# Patient Record
Sex: Male | Born: 1985 | Race: White | Hispanic: No | Marital: Single | Smoking: Former smoker
Health system: Southern US, Community
[De-identification: ages and names within clinical notes are randomized; demographics above are authoritative.]

## PROBLEM LIST (undated history)

## (undated) ENCOUNTER — Emergency Department (HOSPITAL_BASED_OUTPATIENT_CLINIC_OR_DEPARTMENT_OTHER): Admission: EM

## (undated) DIAGNOSIS — I10 Essential (primary) hypertension: Secondary | ICD-10-CM

## (undated) DIAGNOSIS — F1120 Opioid dependence, uncomplicated: Secondary | ICD-10-CM

## (undated) DIAGNOSIS — K859 Acute pancreatitis without necrosis or infection, unspecified: Secondary | ICD-10-CM

## (undated) DIAGNOSIS — F1020 Alcohol dependence, uncomplicated: Secondary | ICD-10-CM

## (undated) DIAGNOSIS — R7303 Prediabetes: Secondary | ICD-10-CM

## (undated) DIAGNOSIS — F418 Other specified anxiety disorders: Secondary | ICD-10-CM

## (undated) HISTORY — DX: Opioid dependence, uncomplicated: F11.20

## (undated) HISTORY — DX: Prediabetes: R73.03

## (undated) HISTORY — DX: Other specified anxiety disorders: F41.8

## (undated) HISTORY — DX: Alcohol dependence, uncomplicated: F10.20

## (undated) HISTORY — PX: OTHER SURGICAL HISTORY: SHX169

---

## 2005-09-27 ENCOUNTER — Emergency Department: Payer: Self-pay | Admitting: Emergency Medicine

## 2006-10-06 ENCOUNTER — Emergency Department: Payer: Self-pay | Admitting: Emergency Medicine

## 2008-09-27 ENCOUNTER — Emergency Department: Payer: Self-pay | Admitting: Unknown Physician Specialty

## 2009-07-09 IMAGING — CT CT HEAD WITHOUT CONTRAST
3 of 4 series · 17 of 30 positions shown, 20 images · non-contrast
Comparison: none

REASON FOR EXAM: head injury
COMMENTS:

[Series 2: without · axial · non-contrast · 0.44mm/px · z∈[+6,+141]mm · 9 of 35 slices shown, 12 images (1 of 2)]
[im 4/35  brain]
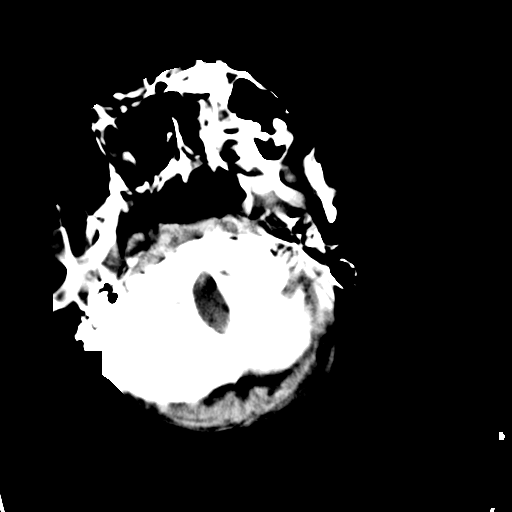
[im 4/35  bone]
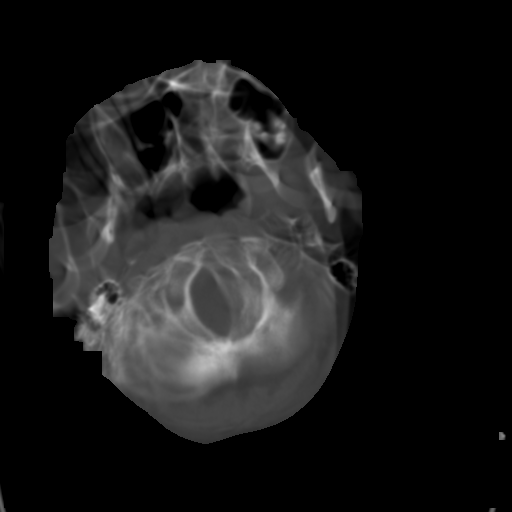
[im 7/35  brain]
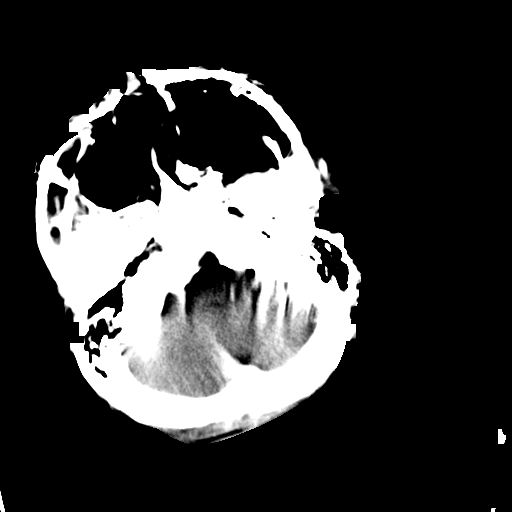
[im 11/35  brain]
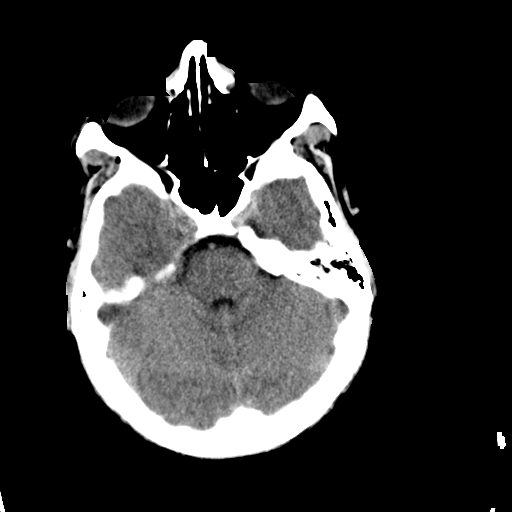
[im 14/35  brain]
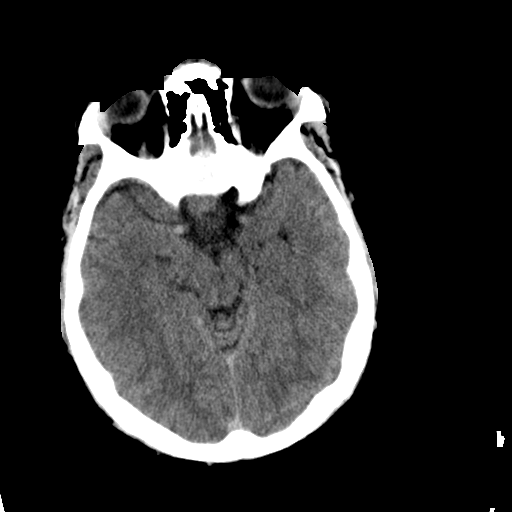
[im 18/35  brain]
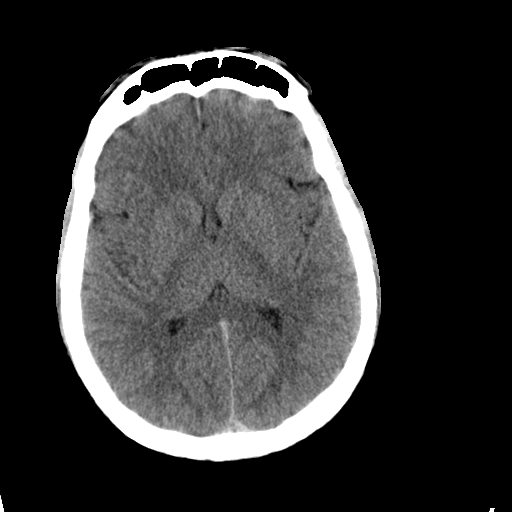
[im 18/35  bone]
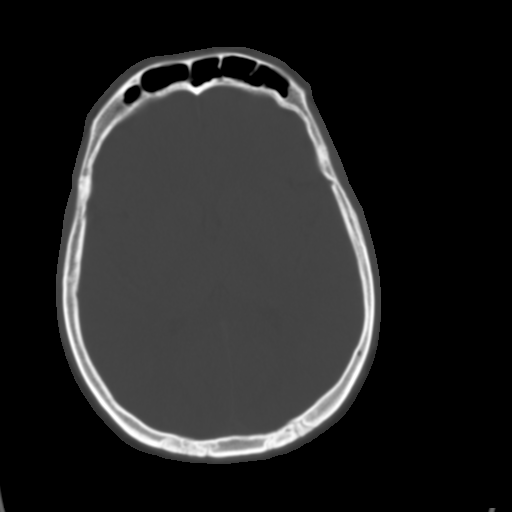
[im 21/35  brain]
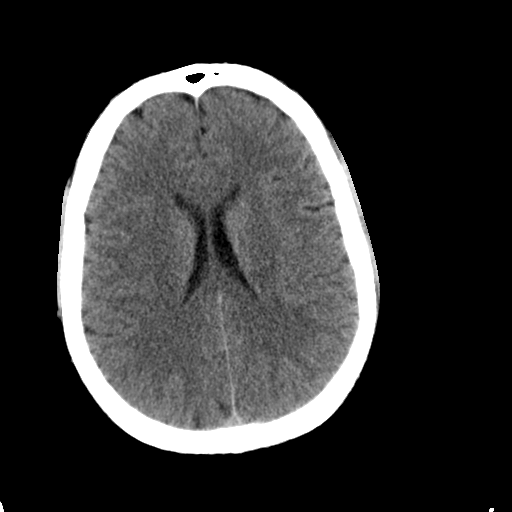
[im 24/35  brain]
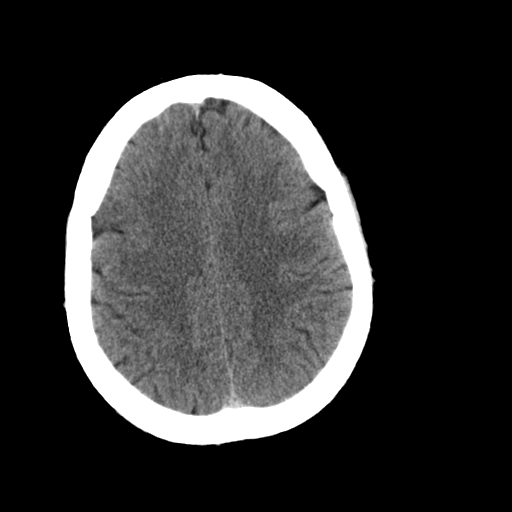
[im 28/35  brain]
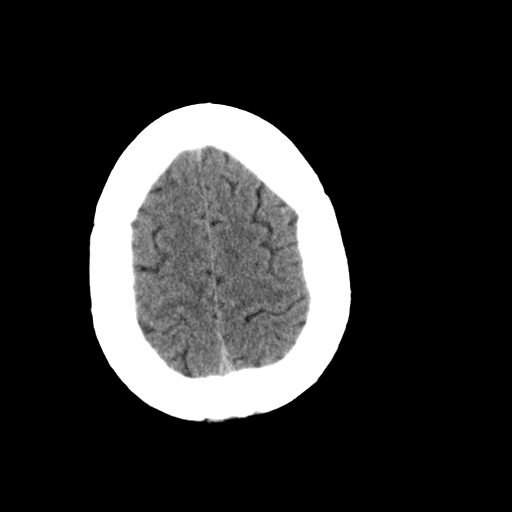
[im 31/35  brain]
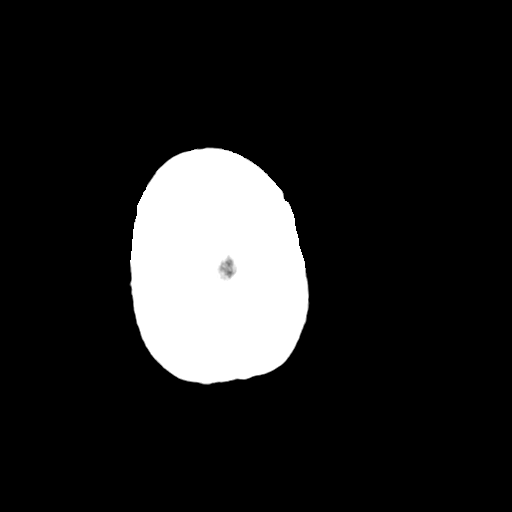
[im 31/35  bone]
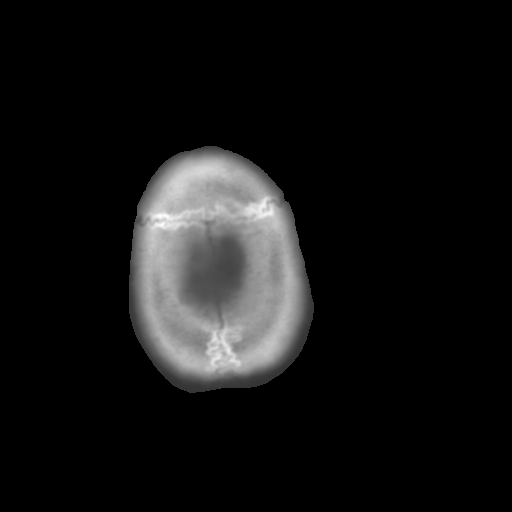

[Series 3: bone · axial · 0.44mm/px · z∈[+6,+106]mm · 6 of 35 slices shown]
[im 4/35  bone]
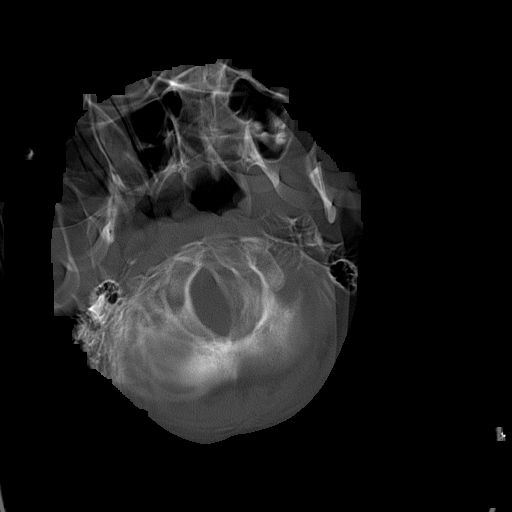
[im 7/35  bone]
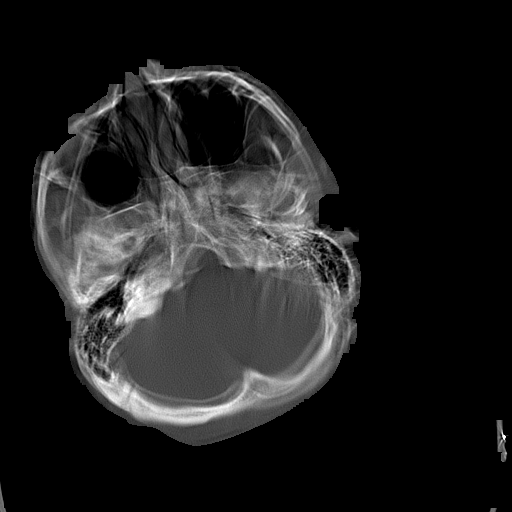
[im 11/35  bone]
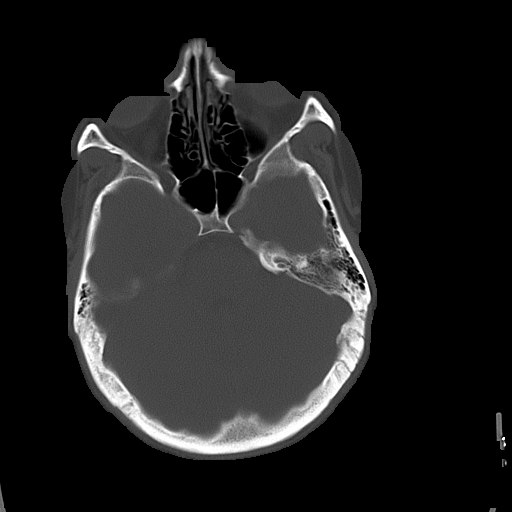
[im 14/35  bone]
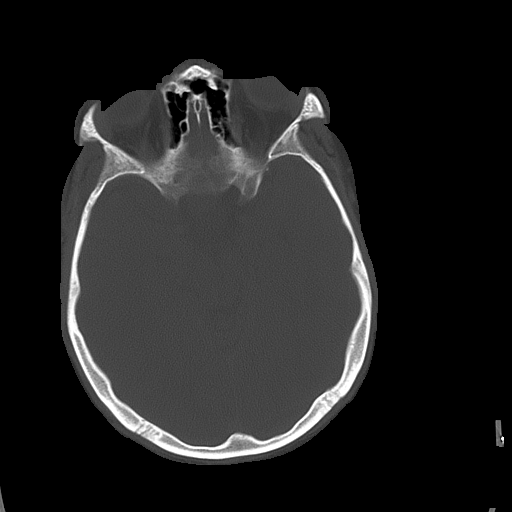
[im 21/35  bone]
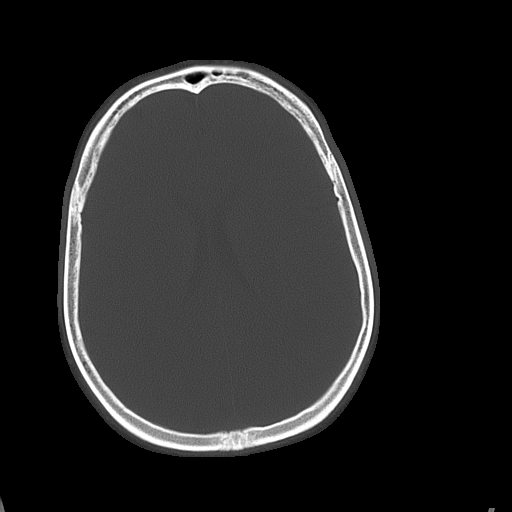
[im 24/35  bone]
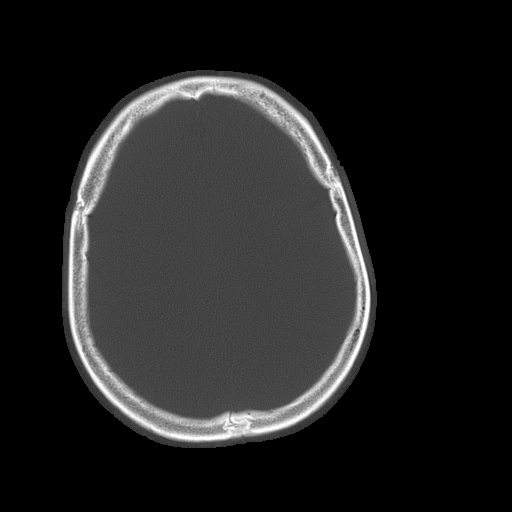

[Series 4: without · axial · non-contrast · 0.44mm/px · z∈[+11,+36]mm · 2 of 15 slices shown (2 of 2)]
[im 5/15  brain]
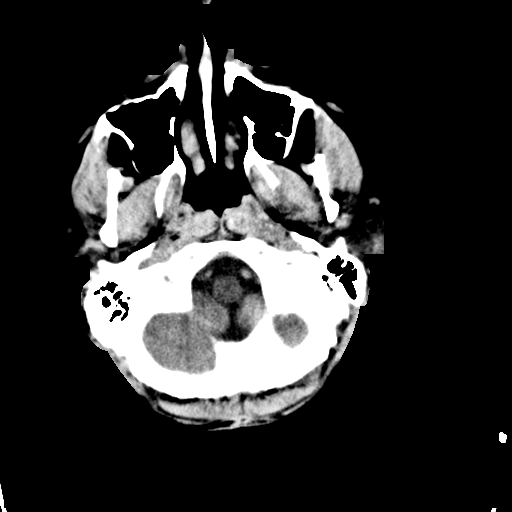
[im 10/15  brain]
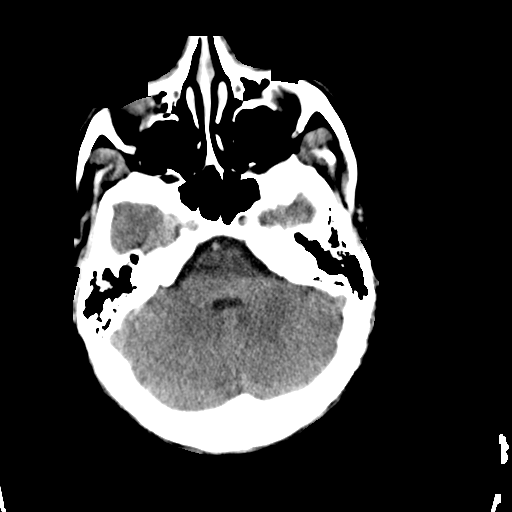

[17 of 30 positions shown; findings below may reference images not displayed]

PROCEDURE:     CT  - CT HEAD WITHOUT CONTRAST  - September 27, 2008  [DATE]

RESULT:     Noncontrast emergent CT of the brain is performed. There is no
previous exam for comparison.

The ventricles and sulci are normal. There is no hemorrhage. There is no
focal mass, mass-effect or midline shift. There is no evidence of edema or
territorial infarct. The bone windows demonstrate normal aeration of the
paranasal sinuses and mastoid air cells. There is no skull fracture
demonstrated.
IMPRESSION: 1. No acute intracranial abnormality.

## 2016-03-18 ENCOUNTER — Encounter: Payer: Self-pay | Admitting: Emergency Medicine

## 2016-03-18 ENCOUNTER — Telehealth: Payer: Self-pay | Admitting: General Practice

## 2016-03-18 ENCOUNTER — Ambulatory Visit
Admission: EM | Admit: 2016-03-18 | Discharge: 2016-03-18 | Disposition: A | Discharge status: Home / Self Care | Payer: BLUE CROSS/BLUE SHIELD

## 2016-03-18 DIAGNOSIS — M7711 Lateral epicondylitis, right elbow: Secondary | ICD-10-CM | POA: Diagnosis not present

## 2016-03-18 DIAGNOSIS — J0101 Acute recurrent maxillary sinusitis: Secondary | ICD-10-CM | POA: Diagnosis not present

## 2016-03-18 DIAGNOSIS — M7712 Lateral epicondylitis, left elbow: Secondary | ICD-10-CM

## 2016-03-18 DIAGNOSIS — H6593 Unspecified nonsuppurative otitis media, bilateral: Secondary | ICD-10-CM | POA: Diagnosis not present

## 2016-03-18 MED ORDER — DICLOFENAC 18 MG PO CAPS: 18.0000 mg | ORAL_CAPSULE | Freq: Three times a day (TID) | ORAL | 0 refills | Status: DC

## 2016-03-18 MED ORDER — FLUTICASONE PROPIONATE 50 MCG/ACT NA SUSP: 1.0000 | Freq: Two times a day (BID) | NASAL | 0 refills | Status: AC

## 2016-03-18 MED ORDER — DICLOFENAC SODIUM 75 MG PO TBEC: 75.0000 mg | DELAYED_RELEASE_TABLET | Freq: Two times a day (BID) | ORAL | 0 refills | Status: AC

## 2016-03-18 MED ORDER — SALINE NASAL SPRAY 0.65 % NA SOLN: 2.0000 | NASAL | 12 refills | Status: AC

## 2016-03-18 MED ORDER — AMOXICILLIN-POT CLAVULANATE 875-125 MG PO TABS: 1.0000 | ORAL_TABLET | Freq: Two times a day (BID) | ORAL | 0 refills | Status: AC

## 2016-03-18 MED ORDER — NAPROXEN 500 MG PO TABS: 500.0000 mg | ORAL_TABLET | Freq: Two times a day (BID) | ORAL | 0 refills | Status: DC

## 2016-03-18 NOTE — ED Provider Notes (Signed)
CSN: [id number]     Arrival date & time 03/18/16  1449 History   None    Chief Complaint  Patient presents with  . Cough  . Headache  . Elbow Pain   (Consider location/radiation/quality/duration/timing/severity/associated sxs/prior Treatment) Single caucasian male here for evaluation sinus pressure, ear pressure x 2 days, nasal congestion didn't go to work today as felt bad.  Also having bilateral elbow pain left x 4 months and right x 3 months has tried elbow straps and advil/aleve/motrin OTC po without any relief would like stronger medication/steroids.  Has not been icing.  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  PMHX ADHD, alcohol abuse, alcohol gastritis, arm fracture  PSHx denied  FHx denied cancer      History reviewed. No pertinent past medical history. History reviewed. No pertinent surgical history. History reviewed. No pertinent family history. Social History  Substance Use Topics  . Smoking status: Current Every Day Smoker    Packs/day: 1.00    Types: Cigarettes  . Smokeless tobacco: Never Used  . Alcohol use Yes    Review of Systems  Constitutional: Positive for chills and fatigue. Negative for activity change, appetite change, diaphoresis, fever and unexpected weight change.  HENT: Positive for congestion, ear pain, postnasal drip, rhinorrhea and sinus pressure. Negative for dental problem, drooling, ear discharge, facial swelling, hearing loss, mouth sores, nosebleeds, sneezing, sore throat, tinnitus, trouble swallowing and voice change.   Eyes: Negative for photophobia, pain, discharge, redness, itching and visual disturbance.  Respiratory: Positive for cough. Negative for choking, chest tightness, shortness of breath, wheezing and stridor.   Cardiovascular: Negative for chest pain, palpitations and leg swelling.  Gastrointestinal: Negative for abdominal distention, abdominal pain, blood in stool, constipation, diarrhea, nausea and vomiting.  Endocrine:  Negative for cold intolerance and heat intolerance.  Genitourinary: Negative for dysuria.  Musculoskeletal: Positive for arthralgias and myalgias. Negative for back pain, gait problem, joint swelling, neck pain and neck stiffness.  Skin: Negative for color change, pallor, rash and wound.  Allergic/Immunologic: Positive for environmental allergies. Negative for food allergies and immunocompromised state.  Neurological: Negative for dizziness, tremors, seizures, syncope, facial asymmetry, speech difficulty, weakness, light-headedness, numbness and headaches.  Hematological: Negative for adenopathy. Does not bruise/bleed easily.  Psychiatric/Behavioral: Negative for agitation, behavioral problems, confusion and sleep disturbance.    Allergies  Review of patient's allergies indicates no known allergies.  Home Medications   Prior to Admission medications   Medication Sig Start Date End Date Taking? Authorizing Provider  amphetamine-dextroamphetamine (ADDERALL XR) 20 MG 24 hr capsule Take 20 mg by mouth daily.   Yes Historical Provider, MD  amoxicillin-clavulanate (AUGMENTIN) 875-125 MG tablet Take 1 tablet by mouth every 12 (twelve) hours. 03/18/16 03/28/16  Barbaraann Barthel, NP  Diclofenac 18 MG CAPS Take 18 mg by mouth 3 (three) times daily. 03/18/16 04/17/16  Barbaraann Barthel, NP  fluticasone (FLONASE) 50 MCG/ACT nasal spray Place 1 spray into both nostrils 2 (two) times daily. 03/18/16   Barbaraann Barthel, NP  sodium chloride (RA SALINE NASAL SPRAY) 0.65 % nasal spray Place 2 sprays into the nose every 2 (two) hours while awake. 03/18/16 03/28/16  Barbaraann Barthel, NP   Meds Ordered and Administered this Visit  Medications - No data to display  BP 133/81 (BP Location: Left Arm)   Pulse 100   Temp 99.3 F (37.4 C) (Tympanic)   Resp 16   SpO2 99%  No data found.   Physical Exam  Constitutional:  He is oriented to person, place, and time. Vital signs are normal. He appears well-developed  and well-nourished. He is active and cooperative.  Non-toxic appearance. He does not have a sickly appearance. He appears ill. No distress.  HENT:  Head: Normocephalic and atraumatic.  Right Ear: Hearing, external ear and ear canal normal. A middle ear effusion is present.  Left Ear: Hearing, external ear and ear canal normal. A middle ear effusion is present.  Nose: Mucosal edema and rhinorrhea present. No nose lacerations, sinus tenderness, nasal deformity, septal deviation or nasal septal hematoma. No epistaxis.  No foreign bodies. Right sinus exhibits maxillary sinus tenderness. Right sinus exhibits no frontal sinus tenderness. Left sinus exhibits maxillary sinus tenderness. Left sinus exhibits no frontal sinus tenderness.  Mouth/Throat: Uvula is midline and mucous membranes are normal. Mucous membranes are not pale, not dry and not cyanotic. He does not have dentures. No oral lesions. No trismus in the jaw. Normal dentition. No dental abscesses, uvula swelling, lacerations or dental caries. Posterior oropharyngeal edema and posterior oropharyngeal erythema present. No oropharyngeal exudate or tonsillar abscesses.  Cobblestoning posterior pharynx; bilateral nasal turbinates edema/erythema yellow discharge, bilateral TMs with air fluid level slight opacity; bilateral allergic shiners  Eyes: Conjunctivae, EOM and lids are normal. Pupils are equal, round, and reactive to light. Right eye exhibits no chemosis, no discharge, no exudate and no hordeolum. No foreign body present in the right eye. Left eye exhibits no chemosis, no discharge, no exudate and no hordeolum. No foreign body present in the left eye. Right conjunctiva is not injected. Right conjunctiva has no hemorrhage. Left conjunctiva is not injected. Left conjunctiva has no hemorrhage. No scleral icterus. Right eye exhibits normal extraocular motion and no nystagmus. Left eye exhibits normal extraocular motion and no nystagmus. Right pupil is round  and reactive. Left pupil is round and reactive. Pupils are equal.  Neck: Trachea normal and normal range of motion. Neck supple. No tracheal tenderness, no spinous process tenderness and no muscular tenderness present. No neck rigidity. No tracheal deviation, no edema, no erythema and normal range of motion present. No thyroid mass and no thyromegaly present.  Cardiovascular: Normal rate, regular rhythm, S1 normal, S2 normal, normal heart sounds and intact distal pulses.  PMI is not displaced.  Exam reveals no gallop and no friction rub.   No murmur heard. Pulses:      Radial pulses are 2+ on the right side, and 2+ on the left side.  Pulmonary/Chest: Effort normal and breath sounds normal. No stridor. No respiratory distress. He has no decreased breath sounds. He has no wheezes. He has no rhonchi. He has no rales.  Abdominal: Soft. Normal appearance. He exhibits no distension.  Musculoskeletal: Normal range of motion. He exhibits no edema.       Right shoulder: Normal.       Left shoulder: Normal.       Right elbow: He exhibits normal range of motion, no swelling, no effusion, no deformity and no laceration. Tenderness found. Lateral epicondyle tenderness noted. No radial head, no medial epicondyle and no olecranon process tenderness noted.       Left elbow: He exhibits normal range of motion, no swelling, no effusion, no deformity and no laceration. Tenderness found. Lateral epicondyle tenderness noted. No radial head, no medial epicondyle and no olecranon process tenderness noted.       Right wrist: Normal.       Left wrist: Normal.       Right hip: Normal.  Left hip: Normal.       Right knee: Normal.       Left knee: Normal.       Right ankle: Normal.       Left ankle: Normal.       Cervical back: Normal.       Thoracic back: Normal.       Lumbar back: Normal.       Right upper arm: Normal.       Left upper arm: Normal.       Right forearm: He exhibits tenderness. He exhibits no  bony tenderness, no swelling, no edema, no deformity and no laceration.       Left forearm: He exhibits tenderness. He exhibits no bony tenderness, no swelling, no edema, no deformity and no laceration.       Right hand: Normal.       Left hand: Normal.  Lateral epicondyle soft tissue TTP bilaterally right greater than left; mild medial TTP bilaterally; pain with rotation forearms anterior to posterior (medially) bilaterally no crepitus no noticeable edema  Lymphadenopathy:       Head (right side): No submental, no submandibular, no tonsillar, no preauricular, no posterior auricular and no occipital adenopathy present.       Head (left side): No submental, no submandibular, no tonsillar, no preauricular, no posterior auricular and no occipital adenopathy present.    He has no cervical adenopathy.       Right cervical: No superficial cervical, no deep cervical and no posterior cervical adenopathy present.      Left cervical: No superficial cervical, no deep cervical and no posterior cervical adenopathy present.  Neurological: He is alert and oriented to person, place, and time. He is not disoriented. He displays no atrophy and no tremor. No cranial nerve deficit or sensory deficit. He exhibits normal muscle tone. He displays no seizure activity. Coordination and gait normal. GCS eye subscore is 4. GCS verbal subscore is 5. GCS motor subscore is 6.  Bilateral hand grasp equal 5/5 arom x 4 extremities strength equal gait sure and steady in hallway  Skin: Skin is warm, dry and intact. No abrasion, no bruising, no burn, no ecchymosis, no laceration, no lesion, no petechiae and no rash noted. He is not diaphoretic. No cyanosis or erythema. No pallor. Nails show no clubbing.  Psychiatric: He has a normal mood and affect. His speech is normal and behavior is normal. Judgment and thought content normal. Cognition and memory are normal.  Nursing note and vitals reviewed.   Urgent Care Course   Clinical  Course    Procedures (including critical care time)  Labs Review Labs Reviewed - No data to display  Imaging Review No results found.   Reviewed [place] Controlled Substances Registry for previous year of Rx history see below copied:  Last Name:  [facility]:  First Name:  Fouad  Zip Code:  Date of Birth:  02-25-1986  Dispensed Start Date:  03/09/2015 Dispensed End Date:  03/18/2016  Recipients:      Dispensed From 03/09/2015 to 03/18/2016 1 out of 1 Recipient(s) Selected Omar, Melanie - DOB: October 01, 1985 - [address] " Date Dispensed/ Date Prescribed Drug Name/ NDC Qty. Dispensed/ Days Supply Refill #/ Authorized Refills Gaffer Recipient *Pmt. Method **MED Daily  03/14/2016 03/14/2016 DEXTROAMP- AMPHETAMIN 20 MG TAB [id number] 84 28 0 0 [id number] [facility] [place], [place] [id number] [facility] [place], [place] Folse, Shreyan [birth year] [address]  [place], [place] [address] 04 0  02/15/2016 02/15/2016 BUPRENORPHIN- NALOXON 8- 2 MG SL [id number] 70 28 0 0 [id number] [place] [facility] [facility], [place] [id number] [facility] CENTRAL OUTPATIENT [address] [place] NAIF, ALABI 04-29-1986 [address] [place], [place] [address] 04 600  02/15/2016 02/15/2016 DEXTROAMP- AMPHETAMIN 20 MG TAB [id number] 84 28 0 0 [id number] [place], [place] [id number] [place] [facility] [place], [place], Kaymon 26-Feb-1986 [address] [place], [place] [address] 04 0  01/25/2016 01/25/2016 BUPRENORPHIN- NALOXON 8- 2 MG SL [id number] 42 21 0 0 [id number] [place], [place] [id number] [facility]  CENTRAL OUTPATIENT [address] [place] ANITA, MCADORY Mar 18, 1986 [address] [place], [place] [address] 04 480  01/25/2016 01/25/2016 DEXTROAMP- AMPHETAMIN 20 MG TAB [id number] 63 21 0 0 [id number] [place], [place] [id number] [place] [facility] [place], [place],  Zidane 08-02-1986 [address] [place], [place] [address] 04 0  01/11/2016 01/11/2016 BUPRENORPHIN- NALOXON 8- 2 MG SL [id number] 28 14 0 0 [id number] [place], [place] [id number] [facility] CENTRAL OUTPATIENT [address] [place] QUINTAVIS, BRANDS 27-Jul-1986 [address] [place], [place] [address] 04 480  01/11/2016 01/11/2016 DEXTROAMP- AMPHETAMIN 20 MG TAB [id number] 42 14 0 0 [id number] [facility] [place], [place] [id number] [place] [facility] [place], [place], Keniel [birth year] [address] [place], [place] [address] 04 0  12/30/2015 12/30/2015 BUPRENORPHIN- NALOXON 8- 2 MG SL [id number] 28 14 0 0 [id number] [place] ROB[place], [place] [id number] [facility] CENTRAL OUTPATIENT [address], [place] YURI, FANA 1985-12-21 [address] [place], [place] [address] 04 480  12/30/2015 12/30/2015 DEXTROAMP- AMPHETAMIN 20 MG TAB [id number] 42 14 0 0 [id number] [facility] [place], [place] [id number] [facility] [place], [place] Costales, Becket 09/15/1985 [address] [place], [place] [address] 04 0  11/30/2015 11/30/2015 BUPRENORPHIN- NALOXON 8- 2 MG SL [id number] 56 28 0 0 [id number] [place] ROB[place], [place] [id number] [facility] CENTRAL OUTPATIENT [address], [place] DEWAINE, MOROCHO Dec 23, 1985 [address] [place], [place] [address] 04 480  11/30/2015 11/30/2015 DEXTROAMP- AMPHETAMIN 20 MG TAB [id number] 84 28 0 0 [id number] [facility] [address] [place], [place] [id number] [facility] [place], [place] Prospero, Romulo [birth year] [address] [place], [place] [address] 04 0  11/16/2015 11/16/2015 BUPRENORPHIN- NALOXON 8- 2 MG SL [id number] 28 14 0 0 [id number] [place] ROB[place], [place] [id number] [facility] CENTRAL OUTPATIENT [address] [place] LUISDAVID, HAMBLIN [birth year] [address] [place], [place] [address] 04 480  11/16/2015 11/16/2015 DEXTROAMP- AMPHETAMIN 20 MG TAB [id number] 42 14 0 0  [id number] [facility] [address] [place], [place] [id number] ECKERD [place], [place] Summer, Raequon 03/12/1986 [address] [place], [place] [address] 04 0  11/02/2015 11/02/2015 BUPRENORPHIN- NALOXON 8- 2 MG SL [id number] 28 14 0 0 [id number] [place] ROB[place], [place] [id number] [facility] CENTRAL [address], [place] YOREL, REDDER 02-Aug-1986 [address] [place], [place] [address] 04 480  11/02/2015 11/02/2015 DEXTROAMP- AMPHETAMIN 20 MG TAB [id number] 42 14 0 0 [id number] [place], [place] [id number] [place] [facility] [place], [place], Gerado 19-Jun-1986 [address] [place], [place] [address] 04 0  09/29/2015 08/31/2015 BUPRENORPHIN- NALOXON 8- 2 MG SL [id number] 56 28 1 1  [id number] [place] ROB[place], [place] [id number] [facility] CENTRAL OUTPATIENT [address] [place] KASHAUN, BEBO Mar 29, 1986 [address] [place], [place] [address] 04 480  09/28/2015 08/31/2015 DEXTROAMP- AMPHETAMIN 20  MG TAB [id number] 90 30 0 0 [id number] [facility] [place] [id number] [place] [facility] [place], [place]uv[place], [place] [birth year] [address] [place], [place] [id number] 04 0  08/31/2015 08/31/2015 DEXTROAMP- AMPHETAMIN 20 MG TAB [id number] 90 30 0 0 [id number] [facility] [place], [place] [id number] [facility] [place], Ignacio [place], [place] [birth year] [address] [place], [place] [id number] 04 0  08/31/2015 08/31/2015 BUPRENORPHIN- NALOXON 8- 2 MG SL [id number] 56 28 0 1 [id number] [place] ROB[place], [place] [id number] [facility] CENTRAL OUTPATIENT [address]PHARMACY [place], [place] EzelMOORE, Demonie G [birth year] [address] [place], [place] [id number] 04 480  08/21/2015 08/17/2015 BUPRENORPHIN- NALOXON 8- 2 MG SL [id number] 28 14 0 0 [id number] [place] ROB[place], [place] [id number] [facility] CEN[facility]AL [facility]PATIENT [address]PHARMACY [place], [place] LatheMOORE, Ismeal G [birth year] [address] [place], [place] [id number] 04 480    08/18/2015 08/18/2015 DEXTROAMP- AMPHETAMIN 20 MG TAB [id number] 42 14 0 0 [id number] [address] [facility] [place], [place] [id number] [facility] ChoughWALGRE[facility] [place], [place], Kroy [birth year] [address] [place], [place] [id number] 04 0  07/29/2015 06/29/2015 BUPRENORPHIN- NALOXON 8- 2 MG SL [id number] 56 28 1 1  [id number] [place] ROB[place], [place] [id number] [facility] CEN[facility]AL OUTPATIENT [address]PHARMACY [place], [place] LatheMOORE, Maxmilian G [birth year] [address] [place], [place] [id number] 04 480  06/29/2015 06/29/2015 DEXTROAMP- AMPHETAMIN 20 MG TAB [id number] 168 56 0 0 [id number] [facility] [place], [place] [id number] [facility] ChoughWALGRE[facility] [place], [place] Anzaldo, Dragan [birth year] [address] [place], [place] [id number] 04 0  06/29/2015 06/29/2015 BUPRENORPHIN- NALOXON 8- 2 MG SL [id number] 56 28 0 1 [id number] [place] ROB[place], [place] [id number] [facility] CEN[facility]AL OUTPATIENT [address] [place], [place] LatheMOORE, Montel G [birth year] [address] [place], [place] [id number] 04 480  06/11/2015 06/01/2015 DEXTROAMP- AMPHETAMIN 20 MG TAB [id number] 42 14 0 0 [id number] [facility] [place] [id number] Ber[facility] ChoughWALGRE[facility] [place], [place], [place] [birth year] [address] [place], [place] [id number] 04 0  06/01/2015 06/01/2015 BUPRENORPHIN- NALOXON 8- 2 MG SL [id number] 60 30 0 0 [id number] [place], [place] [id number] [facility] CENTRAL OUTPATIENT [address]PHARMACY [place], [place] LatheMOORE, Anh G [birth year] [address] [place], [place] [id number] 04 480  05/18/2015 05/18/2015 DEXTROAMP- AMPHETAMIN 20 MG TAB [id number] 84 28 0 0 [id number] [facility] [place] [id number] [facility] ChoughW[facility] [address] [place], [place] Curless, Murl [birth year] [address] [place], [place] [id number] 04 0  05/18/2015 05/18/2015 BUPRENORPHIN- NALOXON 8- 2 MG SL [id number] 30 15 0 0 [id number] [address] StreetGARBUT[address] JAMES CAMERON MD [place], [place] [id number] [facility] CENTRAL OUTPATIENT [address]PHARMACY [place], [place] LatheMOORE, Quincey G [birth year] [address] [place], [place] [id number] 04 480  05/04/2015 05/04/2015 DEXTROAMP- AMPHETAMIN 20 MG TAB [id number] 42 14 0 0 [id number] [address]WE[facility] MEM[facility] [place] [id number] Ber[facility] ChoughWALGREEN [facility] [place], [place] Krone, Bienvenido [birth year] [address] [place], [place] [id number] 04 0  05/04/2015 05/04/2015 BUPRENORPHIN- NALOXON 8- 2 MG SL [id number] 28 14 0 5 [id number] [address] StreetGARBUTT JAMES CAMERON MD [place], [place] [id number] [facility] CENTRAL OUTPATIENT [address] [place],  [place] LatheMOORE, Anjelo G [birth year] [address] [place], [place] [id number] 04 480  04/27/2015 04/24/2015 BUPRENORPHIN- NALOXON 8- 2 MG SL [id number] 14 7 0 1 [id number] [address]GARBUTT JAMES CAMERON MD [place], [place] [id number] [facility] CENTRAL OUTPATIENT [address]PHARMACY [place],  [place] LatheMOORE, Yuepheng G [birth year] [address] [place], [place] [id number] 04 480  04/21/2015 04/20/2015 BUPRENORPHIN- NALOXON 8- 2 MG SL [id number] 14 7 0 0 [id number] [address]GARBUTT JAMES CAMERON MD [place], [place] [id number] [facility]ENTRAL  OUTPATIENT [address] [place], [place] Nack, Manoah G Jul 03, 1986 [address] [place], [place] [address] 04 480  04/20/2015 04/20/2015 DEXTROAMP- AMPHETAMIN 20 MG TAB [id number] 42 14 0 0 [id number] [facility] [place], [place] [id number] Berline Chough, [place] Dickison, Devonta [birth year] [address] [place], [place] [address] 04 0  04/09/2015 04/09/2015 DEXTROAMP- AMPHETAMIN 20 MG TAB [id number] 33 11 0 0 [id number] [facility] [place], [place] [id number] Berline Chough, [place] Marcella, Jaecion 05-05-1986 [address] [place], [place] [address] 04 0  03/26/2015 02/23/2015 DEXTROAMP- AMPHETAMIN 20 MG TAB [id number] 84 28 0 0 [id number] [facility] [place], [place] [id number] Berline Chough, [place] Yeh, Ladainian [birth year] [address] [place], [place] [address] 04 0  03/26/2015 02/23/2015 BUPRENORPHIN- NALOXON 8- 2 MG SL [id number] 56 28 0 0 [id number] [address] MD [place], [place] [id number] [facility] CENTRAL  OUTPATIENT PHARMACY [place], [place], Nissim G [birth year] [address] [place], [place] [address] 04 [address]    *Pmt. Method:01=Private Pay; 02=Medicaid; 03=Medicare; 04=Commercial Insurance; Psychologist, educational and VA; 06=Worker's Compensation; 07=[place] Nations; 99=Other  **Per CDC guidance, the conversion factors and associated daily morphine milligram equivalents for drugs prescribed as part of medication-assisted treatment for opioid use disorder should not be used to benchmark against dosage thresholds meant for opioids prescribed for pain. MED Summary This section displays cumulative MED values by unique recipient. The "MED Max" value is the maximum occurrence of cumulative MED sustained for any 3 consecutive days. This value is calculated based on prescriptions dispensed during the date range requested.  MED Max Recipient  [address] [place] [birth year] [address] [place], [place] [address]      MDM   1. Acute recurrent maxillary sinusitis   2. Lateral epicondylitis (tennis elbow), left   3. Lateral epicondylitis (tennis elbow), right   4. Otitis media with effusion, bilateral    Patient given [facility] handout on elbow tendonitis lateral with rehab exercises. Patient has been using elbow strap unilaterally discussed wear bilaterally.  Discussed up to date treatment guidelines recommended imaging if pain longer than 6 weeks and not responsive to OTC nsaids.  Patient has been self treating at home with strap and OTC nsaids for months and symptoms worsening.  Patient refused imaging today.  Discussed next step would be formal PT/Orthopedics consults with possible injections by orthopedics.  Discussed  Repetitive motion injury due to work duties. Discussed rest, nsaid x 2 weeks, stretching, lateral epicondylitis strap purchase OTC none available in clinic and icing TID.  Patient will schedule follow up with [facility] for Physical Therapy referral if no relief with plan of care.  Discussed  with patient takes longer to resolve when ongoing for months consider orthopedics and steroid injection if no relief with NSAID, rest, ice, strap.  Do not take any motrin/advil/aleve/ibuprofen/naproxen while taking diclofenac 75mg  po BID.  Hydrate, hydrate, hydrate, avoid dehydration when taking NSAID. Work excuse given for today. Follow up with PCM if no improvement in symptoms with plan of care.  Patient verbalized understanding of instructions, agreed with plan of care and had no further questions at this time.    Patient may use normal saline nasal spray as needed.  Consider antihistamine OTC claritin/zyrtec OTC 10mg  po daily or nasal steroid use flonase 1 spray each nostril BID.  Avoid triggers if possible.  Shower prior to bedtime if exposed to triggers.  If allergic dust/dust mites recommend mattress/pillow covers/encasements; washing linens, vacuuming, sweeping, dusting weekly.  Call or return to clinic as needed  if these symptoms worsen or fail to improve as anticipated.   Exitcare handout on allergic rhinitis given to patient.  Patient verbalized understanding of instructions, agreed with plan of care and had no further questions at this time.  P2:  Avoidance and hand washing.  Supportive treatment.   No evidence of invasive bacterial infection, non toxic and well hydrated.  This is most likely self limiting viral infection.  I do not see where any further testing or imaging is necessary at this time.   I will suggest supportive care, rest, good hygiene and encourage the patient to take adequate fluids.  The patient is to return to clinic or EMERGENCY ROOM if symptoms worsen or change significantly e.g. ear pain, fever, purulent discharge from ears or bleeding.  Exitcare handout on otitis media with effusion given to patient.  Patient verbalized agreement and understanding of treatment plan.    No evidence of systemic bacterial infection, non toxic and well hydrated.  I do not see where any further  testing or imaging is necessary at this time.   I will suggest supportive care, rest, good hygiene and encourage the patient to take adequate fluids.  The patient is to return to clinic or EMERGENCY ROOM if symptoms worsen or change significantly.  Exitcare handout on sinusitis given to patient.  Patient verbalized agreement and understanding of treatment plan and had no further questions at this time.   P2:  Hand washing and cover cough  start flonase 1 spray each nostril BID, saline 2 sprays each nostril q2h prn congestion.  If no improvement with 48 hours of saline and flonase use start augmentin 875mg  po BID x 10 days.  Rx given.  No evidence of systemic bacterial infection, non toxic and well hydrated.  I do not see where any further testing or imaging is necessary at this time.   I will suggest supportive care, rest, good hygiene and encourage the patient to take adequate fluids.  The patient is to return to clinic or EMERGENCY ROOM if symptoms worsen or change significantly.  Exitcare handout on sinusitis given to patient.  Patient verbalized agreement and understanding of treatment plan and had no further questions at this time.   P2:  Hand washing and cover cough      Barbaraann Barthel, NP 03/18/16 1921

## 2016-03-18 NOTE — ED Triage Notes (Signed)
Patient c/o sinus pressure, HAs, and cough for the past few days.  Patient denies fevers.  Patient also c/o pain in his left elbow for the past 5 months.

## 2016-03-18 NOTE — Telephone Encounter (Signed)
Spoke with pharmacist that naproxen was cancelled and diclofenac ordered per patient preference.  Do not dispense naproxen/cancelled in Epic.  Diclofenac generic or brand name whichever is covered by patient's insurance.  Pharmacist verbalized understanding (he) and had no further questions at this time.

## 2022-11-20 DIAGNOSIS — M5442 Lumbago with sciatica, left side: Secondary | ICD-10-CM | POA: Diagnosis not present

## 2022-11-20 DIAGNOSIS — L732 Hidradenitis suppurativa: Secondary | ICD-10-CM | POA: Diagnosis not present

## 2022-11-27 DIAGNOSIS — R0789 Other chest pain: Secondary | ICD-10-CM | POA: Diagnosis not present

## 2022-11-27 DIAGNOSIS — R519 Headache, unspecified: Secondary | ICD-10-CM | POA: Diagnosis not present

## 2022-11-27 DIAGNOSIS — Z20822 Contact with and (suspected) exposure to covid-19: Secondary | ICD-10-CM | POA: Diagnosis not present

## 2022-11-27 DIAGNOSIS — R112 Nausea with vomiting, unspecified: Secondary | ICD-10-CM | POA: Diagnosis not present

## 2022-11-27 DIAGNOSIS — R1013 Epigastric pain: Secondary | ICD-10-CM | POA: Diagnosis not present

## 2022-11-27 DIAGNOSIS — R079 Chest pain, unspecified: Secondary | ICD-10-CM | POA: Diagnosis not present

## 2022-12-08 DIAGNOSIS — E119 Type 2 diabetes mellitus without complications: Secondary | ICD-10-CM | POA: Diagnosis not present

## 2022-12-08 DIAGNOSIS — L509 Urticaria, unspecified: Secondary | ICD-10-CM | POA: Diagnosis not present

## 2022-12-08 DIAGNOSIS — I10 Essential (primary) hypertension: Secondary | ICD-10-CM | POA: Diagnosis not present

## 2022-12-08 DIAGNOSIS — E871 Hypo-osmolality and hyponatremia: Secondary | ICD-10-CM | POA: Diagnosis not present

## 2022-12-08 DIAGNOSIS — L508 Other urticaria: Secondary | ICD-10-CM | POA: Diagnosis not present

## 2022-12-08 DIAGNOSIS — F411 Generalized anxiety disorder: Secondary | ICD-10-CM | POA: Diagnosis not present

## 2022-12-22 DIAGNOSIS — T7840XA Allergy, unspecified, initial encounter: Secondary | ICD-10-CM | POA: Diagnosis not present

## 2023-03-23 DIAGNOSIS — F411 Generalized anxiety disorder: Secondary | ICD-10-CM | POA: Diagnosis not present

## 2023-03-23 DIAGNOSIS — R1033 Periumbilical pain: Secondary | ICD-10-CM | POA: Diagnosis not present

## 2023-03-23 DIAGNOSIS — I10 Essential (primary) hypertension: Secondary | ICD-10-CM | POA: Diagnosis not present

## 2023-04-01 ENCOUNTER — Encounter: Payer: Self-pay | Admitting: Emergency Medicine

## 2023-04-01 ENCOUNTER — Ambulatory Visit
Admission: EM | Admit: 2023-04-01 | Discharge: 2023-04-01 | Disposition: A | Discharge status: Home / Self Care | Payer: Medicaid Other | Attending: Emergency Medicine | Admitting: Emergency Medicine

## 2023-04-01 DIAGNOSIS — K047 Periapical abscess without sinus: Secondary | ICD-10-CM | POA: Insufficient documentation

## 2023-04-01 DIAGNOSIS — Z1152 Encounter for screening for COVID-19: Secondary | ICD-10-CM | POA: Insufficient documentation

## 2023-04-01 HISTORY — DX: Acute pancreatitis without necrosis or infection, unspecified: K85.90

## 2023-04-01 HISTORY — DX: Essential (primary) hypertension: I10

## 2023-04-01 LAB — SARS CORONAVIRUS 2 BY RT PCR: SARS Coronavirus 2 by RT PCR: NEGATIVE

## 2023-04-01 MED ORDER — ONDANSETRON 8 MG PO TBDP: 8.0000 mg | ORAL_TABLET | Freq: Three times a day (TID) | ORAL | 0 refills | Status: AC | PRN

## 2023-04-01 MED ORDER — CLINDAMYCIN HCL 300 MG PO CAPS: 300.0000 mg | ORAL_CAPSULE | Freq: Three times a day (TID) | ORAL | 0 refills | Status: AC

## 2023-04-01 NOTE — ED Provider Notes (Signed)
[facility]    CSN: [id number] Arrival date & time: 04/01/23  1528      History   Chief Complaint Chief Complaint  Patient presents with   Generalized Body Aches   Fatigue    HPI GILLIAN HALPERT is a 37 y.o. male.   HPI  37 year old male with past medical history significant for pancreatitis and hypertension presents for evaluation of 3 days of feeling poorly along with dental pain and dental drainage.  He denies any fever, runny nose, nasal congestion.  He does have a dentist and he will be contacting them on Monday.  Past Medical History:  Diagnosis Date   Hypertension    Pancreatitis     There are no problems to display for this patient.   History reviewed. No pertinent surgical history.     Home Medications    Prior to Admission medications   Medication Sig Start Date End Date Taking? Authorizing Provider  clindamycin (CLEOCIN) 300 MG capsule Take 1 capsule (300 mg total) by mouth 3 (three) times daily. 04/01/23  Yes Becky Augusta, NP  diazepam (VALIUM) 2 MG tablet Take 2 mg by mouth 2 (two) times daily. 02/24/23  Yes [provider]  diazepam (VALIUM) 5 MG tablet Take 5 mg by mouth 2 (two) times daily. 02/24/23  Yes [provider]  lisinopril (ZESTRIL) 10 MG tablet Take 10 mg by mouth daily.   Yes [provider]  ondansetron (ZOFRAN-ODT) 8 MG disintegrating tablet Take 1 tablet (8 mg total) by mouth every 8 (eight) hours as needed for nausea or vomiting. 04/01/23  Yes Becky Augusta, NP  amphetamine-dextroamphetamine (ADDERALL XR) 20 MG 24 hr capsule Take 20 mg by mouth daily.    [provider]  Buprenorphine HCl-Naloxone HCl 8-2 MG FILM Place under the tongue.    [provider]  diclofenac (VOLTAREN) 75 MG EC tablet Take 1 tablet (75 mg total) by mouth 2 (two) times daily. 03/18/16   Betancourt, Jarold Song, NP  fluticasone (FLONASE) 50 MCG/ACT nasal spray Place 1 spray into both nostrils 2 (two) times daily.  03/18/16   Betancourt, Jarold Song, NP  sodium chloride (RA SALINE NASAL SPRAY) 0.65 % nasal spray Place 2 sprays into the nose every 2 (two) hours while awake. 03/18/16 03/28/16  BetancourtJarold Song, NP    Family History History reviewed. No pertinent family history.  Social History Social History   Tobacco Use   Smoking status: Every Day    Current packs/day: 1.00    Types: Cigarettes   Smokeless tobacco: Never  Vaping Use   Vaping status: Never Used  Substance Use Topics   Alcohol use: Not Currently   Drug use: No     Allergies   Patient has no known allergies.   Review of Systems Review of Systems  Constitutional:  Negative for fever.  HENT:  Positive for dental problem. Negative for facial swelling.   Gastrointestinal:  Positive for nausea.     Physical Exam Triage Vital Signs ED Triage Vitals [04/01/23 1555]  Encounter Vitals Group     BP      Systolic BP Percentile      Diastolic BP Percentile      Pulse      Resp      Temp      Temp src      SpO2      Weight 173 lb (78.5 kg)     Height 5' 9.5" (1.765 m)  Head Circumference      Peak Flow      Pain Score 3     Pain Loc      Pain Education      Exclude from Growth Chart    No data found.  Updated Vital Signs BP (!) 132/91 (BP Location: Right Arm)   Pulse (!) 106   Temp 98.9 F (37.2 C) (Oral)   Resp 15   Ht 5' 9.5" (1.765 m)   Wt 173 lb (78.5 kg)   SpO2 96%   BMI 25.18 kg/m   Visual Acuity Right Eye Distance:   Left Eye Distance:   Bilateral Distance:    Right Eye Near:   Left Eye Near:    Bilateral Near:     Physical Exam Vitals and nursing note reviewed.  Constitutional:      Appearance: Normal appearance. He is not ill-appearing.  HENT:     Mouth/Throat:     Mouth: Mucous membranes are moist.     Pharynx: Oropharyngeal exudate and posterior oropharyngeal erythema present.     Comments: Right upper second molar is broken at the gumline and decayed.  The surrounding gum tissue is  edematous and erythematous.  On the buccal side of the gumline there is a visible pus pocket on the gum. Skin:    General: Skin is warm and dry.     Capillary Refill: Capillary refill takes less than 2 seconds.  Neurological:     General: No focal deficit present.     Mental Status: He is alert and oriented to person, place, and time.      UC Treatments / Results  Labs (all labs ordered are listed, but only abnormal results are displayed) Labs Reviewed  SARS CORONAVIRUS 2 BY RT PCR    EKG   Radiology No results found.  Procedures Procedures (including critical care time)  Medications Ordered in UC Medications - No data to display  Initial Impression / Assessment and Plan / UC Course  I have reviewed the triage vital signs and the nursing notes.  Pertinent labs & imaging results that were available during my care of the patient were reviewed by me and considered in my medical decision making (see chart for details).   Patient is a nontoxic-appearing 37 year old male presenting for evaluation of 3 days worth of dental pain and feeling poorly to include nausea.  On exam he does have broken and decayed tooth with a visible pus pocket along the buccal side of his gumline.  No facial swelling is appreciated.  Posterior oropharynx is patent and unremarkable.  He does have a dentist and he will be contacting him on Monday.  I will discharge him home on clindamycin 300 mg 3 times a day for 7 days along with Zofran 8 mg every 8 hours as needed for nausea.  If he develops any facial swelling, fever, or increased pain I have advised him to go to [facility] to be evaluated by the dentist and oral surgeon on-call.   Final Clinical Impressions(s) / UC Diagnoses   Final diagnoses:  Dental abscess     Discharge Instructions      Take the clindamycin 300 mg 3 times a day with food for 7 days for treatment of your dental abscess.  Use over-the-counter Tylenol and/or ibuprofen  according to package instructions as needed for mild to moderate pain.  Take the Zofran 8 mg ODT's every 8 hours as needed for nausea and vomiting.  Call  Monday and schedule an appointment with your dentist for follow-up.  If you develop any worsening pain, facial swelling, or fevers you need to go to the ER and I recommend going to [facility] as they have a dental school with a dentist and oral surgeon on-call.     ED Prescriptions     Medication Sig Dispense Auth. Provider   clindamycin (CLEOCIN) 300 MG capsule Take 1 capsule (300 mg total) by mouth 3 (three) times daily. 21 capsule Becky Augusta, NP   ondansetron (ZOFRAN-ODT) 8 MG disintegrating tablet Take 1 tablet (8 mg total) by mouth every 8 (eight) hours as needed for nausea or vomiting. 20 tablet Becky Augusta, NP      PDMP not reviewed this encounter.   Becky Augusta, NP 04/01/23 [phone]

## 2023-04-01 NOTE — Discharge Instructions (Addendum)
Take the clindamycin 300 mg 3 times a day with food for 7 days for treatment of your dental abscess.  Use over-the-counter Tylenol and/or ibuprofen according to package instructions as needed for mild to moderate pain.  Take the Zofran 8 mg ODT's every 8 hours as needed for nausea and vomiting.  Call Monday and schedule an appointment with your dentist for follow-up.  If you develop any worsening pain, facial swelling, or fevers you need to go to the ER and I recommend going to [facility] as they have a dental school with a dentist and oral surgeon on-call.

## 2023-04-06 DIAGNOSIS — L0291 Cutaneous abscess, unspecified: Secondary | ICD-10-CM | POA: Diagnosis not present

## 2023-05-12 DIAGNOSIS — F32A Depression, unspecified: Secondary | ICD-10-CM | POA: Diagnosis not present

## 2023-05-12 DIAGNOSIS — F411 Generalized anxiety disorder: Secondary | ICD-10-CM | POA: Diagnosis not present

## 2023-05-12 DIAGNOSIS — R4182 Altered mental status, unspecified: Secondary | ICD-10-CM | POA: Diagnosis not present

## 2023-05-14 DIAGNOSIS — R4182 Altered mental status, unspecified: Secondary | ICD-10-CM | POA: Diagnosis not present

## 2023-05-14 DIAGNOSIS — F29 Unspecified psychosis not due to a substance or known physiological condition: Secondary | ICD-10-CM | POA: Diagnosis not present

## 2023-05-14 DIAGNOSIS — R441 Visual hallucinations: Secondary | ICD-10-CM | POA: Diagnosis not present

## 2023-05-14 DIAGNOSIS — R440 Auditory hallucinations: Secondary | ICD-10-CM | POA: Diagnosis not present

## 2023-05-15 DIAGNOSIS — R4182 Altered mental status, unspecified: Secondary | ICD-10-CM | POA: Diagnosis not present

## 2023-05-24 DIAGNOSIS — R110 Nausea: Secondary | ICD-10-CM | POA: Diagnosis not present

## 2023-05-24 DIAGNOSIS — R4182 Altered mental status, unspecified: Secondary | ICD-10-CM | POA: Diagnosis not present

## 2023-05-24 DIAGNOSIS — H547 Unspecified visual loss: Secondary | ICD-10-CM | POA: Diagnosis not present

## 2023-05-24 DIAGNOSIS — Z23 Encounter for immunization: Secondary | ICD-10-CM | POA: Diagnosis not present

## 2023-05-24 DIAGNOSIS — K5900 Constipation, unspecified: Secondary | ICD-10-CM | POA: Diagnosis not present

## 2023-06-16 DIAGNOSIS — S20362A Insect bite (nonvenomous) of left front wall of thorax, initial encounter: Secondary | ICD-10-CM | POA: Diagnosis not present

## 2023-06-16 DIAGNOSIS — R0789 Other chest pain: Secondary | ICD-10-CM | POA: Diagnosis not present

## 2023-06-16 DIAGNOSIS — R079 Chest pain, unspecified: Secondary | ICD-10-CM | POA: Diagnosis not present

## 2023-06-17 DIAGNOSIS — R079 Chest pain, unspecified: Secondary | ICD-10-CM | POA: Diagnosis not present

## 2023-07-12 DIAGNOSIS — R079 Chest pain, unspecified: Secondary | ICD-10-CM | POA: Diagnosis not present

## 2023-07-13 DIAGNOSIS — R079 Chest pain, unspecified: Secondary | ICD-10-CM | POA: Diagnosis not present

## 2023-12-01 DIAGNOSIS — E119 Type 2 diabetes mellitus without complications: Secondary | ICD-10-CM | POA: Diagnosis not present

## 2023-12-01 DIAGNOSIS — R112 Nausea with vomiting, unspecified: Secondary | ICD-10-CM | POA: Diagnosis not present

## 2023-12-01 DIAGNOSIS — F902 Attention-deficit hyperactivity disorder, combined type: Secondary | ICD-10-CM | POA: Diagnosis not present

## 2023-12-05 DIAGNOSIS — R112 Nausea with vomiting, unspecified: Secondary | ICD-10-CM | POA: Diagnosis not present

## 2024-01-29 DIAGNOSIS — L732 Hidradenitis suppurativa: Secondary | ICD-10-CM | POA: Diagnosis not present

## 2024-01-29 DIAGNOSIS — L570 Actinic keratosis: Secondary | ICD-10-CM | POA: Diagnosis not present

## 2024-02-12 ENCOUNTER — Encounter (HOSPITAL_COMMUNITY): Payer: Self-pay

## 2024-02-12 ENCOUNTER — Emergency Department (HOSPITAL_COMMUNITY)

## 2024-02-12 ENCOUNTER — Other Ambulatory Visit: Payer: Self-pay

## 2024-02-12 ENCOUNTER — Emergency Department (HOSPITAL_COMMUNITY)
Admission: EM | Admit: 2024-02-12 | Discharge: 2024-02-13 | Discharge status: Against Medical Advice | Attending: Emergency Medicine | Admitting: Emergency Medicine

## 2024-02-12 DIAGNOSIS — R5383 Other fatigue: Secondary | ICD-10-CM | POA: Diagnosis not present

## 2024-02-12 DIAGNOSIS — R55 Syncope and collapse: Secondary | ICD-10-CM | POA: Insufficient documentation

## 2024-02-12 DIAGNOSIS — R519 Headache, unspecified: Secondary | ICD-10-CM | POA: Diagnosis not present

## 2024-02-12 DIAGNOSIS — R002 Palpitations: Secondary | ICD-10-CM | POA: Diagnosis not present

## 2024-02-12 LAB — URINALYSIS, ROUTINE W REFLEX MICROSCOPIC
Bilirubin Urine: NEGATIVE
Glucose, UA: NEGATIVE mg/dL
Hgb urine dipstick: NEGATIVE
Ketones, ur: NEGATIVE mg/dL
Leukocytes,Ua: NEGATIVE
Nitrite: NEGATIVE
Protein, ur: NEGATIVE mg/dL
Specific Gravity, Urine: 1.002 — ABNORMAL LOW (ref 1.005–1.030)
pH: 6 (ref 5.0–8.0)

## 2024-02-12 LAB — CBC WITH DIFFERENTIAL/PLATELET
Abs Immature Granulocytes: 0.02 K/uL (ref 0.00–0.07)
Basophils Absolute: 0 K/uL (ref 0.0–0.1)
Basophils Relative: 1 %
Eosinophils Absolute: 0.1 K/uL (ref 0.0–0.5)
Eosinophils Relative: 1 %
HCT: 40.4 % (ref 39.0–52.0)
Hemoglobin: 13.9 g/dL (ref 13.0–17.0)
Immature Granulocytes: 0 %
Lymphocytes Relative: 31 %
Lymphs Abs: 2.4 K/uL (ref 0.7–4.0)
MCH: 31.2 pg (ref 26.0–34.0)
MCHC: 34.4 g/dL (ref 30.0–36.0)
MCV: 90.8 fL (ref 80.0–100.0)
Monocytes Absolute: 0.5 K/uL (ref 0.1–1.0)
Monocytes Relative: 6 %
Neutro Abs: 5 K/uL (ref 1.7–7.7)
Neutrophils Relative %: 61 %
Platelets: 175 K/uL (ref 150–400)
RBC: 4.45 MIL/uL (ref 4.22–5.81)
RDW: 12.6 % (ref 11.5–15.5)
WBC: 8 K/uL (ref 4.0–10.5)
nRBC: 0 % (ref 0.0–0.2)

## 2024-02-12 LAB — COMPREHENSIVE METABOLIC PANEL WITH GFR
ALT: 12 U/L (ref 0–44)
AST: 8 U/L — ABNORMAL LOW (ref 15–41)
Albumin: 4.5 g/dL (ref 3.5–5.0)
Alkaline Phosphatase: 64 U/L (ref 38–126)
Anion gap: 9 (ref 5–15)
BUN: 6 mg/dL (ref 6–20)
CO2: 28 mmol/L (ref 22–32)
Calcium: 9 mg/dL (ref 8.9–10.3)
Chloride: 98 mmol/L (ref 98–111)
Creatinine, Ser: 0.69 mg/dL (ref 0.61–1.24)
GFR, Estimated: >60 mL/min (ref >60)
Glucose, Bld: 106 mg/dL — ABNORMAL HIGH (ref 70–99)
Potassium: 3.2 mmol/L — ABNORMAL LOW (ref 3.5–5.1)
Sodium: 135 mmol/L (ref 135–145)
Total Bilirubin: 1.1 mg/dL (ref 0.0–1.2)
Total Protein: 7.4 g/dL (ref 6.5–8.1)

## 2024-02-12 LAB — RAPID URINE DRUG SCREEN, HOSP PERFORMED
Amphetamines: POSITIVE — AB
Barbiturates: NOT DETECTED
Benzodiazepines: POSITIVE — AB
Cocaine: NOT DETECTED
Opiates: NOT DETECTED
Tetrahydrocannabinol: POSITIVE — AB

## 2024-02-12 LAB — TROPONIN I (HIGH SENSITIVITY): Troponin I (High Sensitivity): 2 ng/L (ref <18)

## 2024-02-12 LAB — ETHANOL: Alcohol, Ethyl (B): <15 mg/dL (ref <15)

## 2024-02-12 NOTE — ED Provider Triage Note (Signed)
 Emergency Medicine Provider Triage Evaluation Note  KEANO GUGGENHEIM , a 38 y.o. male  was evaluated in triage.  Pt complains of feeling off since his suboxone medication two days ago. He reports he took the medication and layed down and felt some burning in his throat. He reports he is unable to describe further other than feeling off. Denies any chest pain, SOB, abdominal pain. Reports he thinks he may have some right eye vision changes and complains of frontal HA. Denies any recent illnesses.  Review of Systems  Positive:  Negative:   Physical Exam  Ht 5' 9.5 (1.765 m)   Wt 80.7 kg   BMI 25.91 kg/m  Gen:   Awake, no distress   Resp:  Normal effort  MSK:   Moves extremities without difficulty  Other:  Moving all remedies.  Normal speech.  Medical Decision Making  Medically screening exam initiated at 7:30 PM.  Appropriate orders placed.  REYNOLD MANTELL was informed that the remainder of the evaluation will be completed by another provider, this initial triage assessment does not replace that evaluation, and the importance of remaining in the ED until their evaluation is complete.  Labs ordered.  CT ordered.   Bernis Ernst, [place] 02/12/24 8066

## 2024-02-12 NOTE — ED Triage Notes (Signed)
 Pt reports 2 days of malaise and near syncope after taking his suboxone. Pt states I think it was just a bad batch.  VSS.

## 2024-02-13 LAB — TROPONIN I (HIGH SENSITIVITY): Troponin I (High Sensitivity): 2 ng/L (ref <18)

## 2024-02-13 MED ORDER — POTASSIUM CHLORIDE CRYS ER 20 MEQ PO TBCR
40.0000 meq | EXTENDED_RELEASE_TABLET | Freq: Once | ORAL | Status: AC
  Administered 2024-02-13: 40 meq via ORAL
  Filled 2024-02-13: qty 2

## 2024-02-13 NOTE — ED Provider Notes (Signed)
 [place] EMERGENCY DEPARTMENT AT [facility] Provider Note   CSN: [id number] Arrival date & time: 02/12/24  8086     Patient presents with: Fatigue and Near Syncope   Lonnie Wilson is a 38 y.o. male.   The history is provided by the patient and medical records.  Near Syncope  Lonnie Wilson is a 38 y.o. male who presents to the Emergency Department complaining of feeling unwell.  He presents to the emergency department for evaluation of feeling unwell since last night.  He states that he has been misusing drugs by snorting, last use was 2 to 3 days ago.  He has been feeling unwell since last night with pressure in his head and ringing in his ears.  No nasal drainage or vision changes.  No numbness or weakness.  Earlier in the day today he folic his heart was racing, which prompted EMS call.  He did briefly have chest discomfort with this episode.  No fever.  Symptoms are overall resolving.  Of note in triage she noted that his vision in his right eye might be different.  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  There is no difference in his vision compared to baseline.     Prior to Admission medications   Medication Sig Start Date End Date Taking? Authorizing Provider  amphetamine-dextroamphetamine (ADDERALL XR) 20 MG 24 hr capsule Take 20 mg by mouth daily.    [provider]  Buprenorphine HCl-Naloxone HCl 8-2 MG FILM Place under the tongue.    [provider]  clindamycin  (CLEOCIN ) 300 MG capsule Take 1 capsule (300 mg total) by mouth 3 (three) times daily. 04/01/23   Bernardino Ditch, NP  diazepam (VALIUM) 2 MG tablet Take 2 mg by mouth 2 (two) times daily. 02/24/23   [provider]  diazepam (VALIUM) 5 MG tablet Take 5 mg by mouth 2 (two) times daily. 02/24/23   [provider]  diclofenac  (VOLTAREN ) 75 MG EC tablet Take 1 tablet (75 mg total) by mouth 2 (two) times daily. 03/18/16    Betancourt, Ellouise LABOR, NP  fluticasone  (FLONASE ) 50 MCG/ACT nasal spray Place 1 spray into both nostrils 2 (two) times daily. 03/18/16   Betancourt, Ellouise LABOR, NP  lisinopril (ZESTRIL) 10 MG tablet Take 10 mg by mouth daily.    [provider]  ondansetron  (ZOFRAN -ODT) 8 MG disintegrating tablet Take 1 tablet (8 mg total) by mouth every 8 (eight) hours as needed for nausea or vomiting. 04/01/23   Bernardino Ditch, NP  sodium chloride (RA SALINE NASAL SPRAY) 0.65 % nasal spray Place 2 sprays into the nose every 2 (two) hours while awake. 03/18/16 03/28/16  Betancourt, Ellouise LABOR, NP    Allergies: Patient has no known allergies.    Review of Systems  Cardiovascular:  Positive for near-syncope.  All other systems reviewed and are negative.   Updated Vital Signs BP (!) 128/96   Pulse 78   Temp 97.9 F (36.6 C) (Oral)   Resp 18   Ht 5' 9.5 (1.765 m)   Wt 80.7 kg   SpO2 97%   BMI 25.91 kg/m   Physical Exam Vitals and nursing note reviewed.  Constitutional:      Appearance: He is well-developed.  HENT:     Head: Normocephalic and atraumatic.     Nose: Nose normal.  Cardiovascular:     Rate and Rhythm: Normal rate and regular rhythm.  Heart sounds: No murmur heard. Pulmonary:     Effort: Pulmonary effort is normal. No respiratory distress.     Breath sounds: Normal breath sounds.  Abdominal:     Palpations: Abdomen is soft.     Tenderness: There is no abdominal tenderness. There is no guarding or rebound.  Musculoskeletal:        General: No tenderness.  Skin:    General: Skin is warm and dry.  Neurological:     Mental Status: He is alert.     Comments: No asymmetry of facial movements, visual fields grossly intact. 5/5 strength in all four extremities with sensation to light touch intact in all four extremities.    Psychiatric:        Behavior: Behavior normal.     (all labs ordered are listed, but only abnormal results are displayed) Labs Reviewed  COMPREHENSIVE METABOLIC  PANEL WITH GFR - Abnormal; Notable for the following components:      Result Value   Potassium 3.2 (*)    Glucose, Bld 106 (*)    AST 8 (*)    All other components within normal limits  RAPID URINE DRUG SCREEN, HOSP PERFORMED - Abnormal; Notable for the following components:   Benzodiazepines POSITIVE (*)    Amphetamines POSITIVE (*)    Tetrahydrocannabinol POSITIVE (*)    All other components within normal limits  URINALYSIS, ROUTINE W REFLEX MICROSCOPIC - Abnormal; Notable for the following components:   Color, Urine STRAW (*)    Specific Gravity, Urine 1.002 (*)    All other components within normal limits  CBC WITH DIFFERENTIAL/PLATELET  ETHANOL  TROPONIN I (HIGH SENSITIVITY)  TROPONIN I (HIGH SENSITIVITY)    EKG: EKG Interpretation Date/Time:  Monday February 12 2024 19:54:35 EDT Ventricular Rate:  86 PR Interval:  144 QRS Duration:  90 QT Interval:  478 QTC Calculation: 572 R Axis:   77  Text Interpretation: Sinus rhythm Borderline T abnormalities, diffuse leads Prolonged QT interval Confirmed by Griselda Norris [phone]) on 02/13/2024 12:17:25 AM  Radiology: CT Head Wo Contrast Result Date: 02/12/2024 CLINICAL DATA:  Headache, increasing frequency or severity, with 2 days of malaise and near syncope. EXAM: CT HEAD WITHOUT CONTRAST TECHNIQUE: Contiguous axial images were obtained from the base of the skull through the vertex without intravenous contrast. RADIATION DOSE REDUCTION: This exam was performed according to the departmental dose-optimization program which includes automated exposure control, adjustment of the mA and/or kV according to patient size and/or use of iterative reconstruction technique. COMPARISON:  Head CT 09/27/2008. FINDINGS: Brain: No evidence of acute infarction, hemorrhage, hydrocephalus, extra-axial collection or mass lesion/mass effect. Vascular: No hyperdense vessel or unexpected calcification. Skull: Negative for fractures or focal lesions. Sinuses/Orbits:  No acute finding. Other: There is right-sided deviation and spurring of the nasal septum. IMPRESSION: 1. No acute intracranial CT findings.  Stable exam. 2. Right-sided deviation and spurring of the nasal septum. Electronically Signed   By: Francis Quam M.D.   On: 02/12/2024 21:56     Procedures   Medications Ordered in the ED  potassium chloride  SA (KLOR-CON  M) CR tablet 40 mEq (40 mEq Oral Given 02/13/24 0150)                                    Medical Decision Making Risk Prescription drug management.   Patient here for evaluation of feeling unwell, did snort drugs yesterday.  He did have  palpitations prior to ED arrival.  Current picture is not consistent with CVA, subarachnoid hemorrhage, ACS, PE.  Patient asymptomatic at time of assessment in the emergency department.  Feel he is stable for discharge home with outpatient follow-up and return precautions.     Final diagnoses:  Other fatigue  Palpitations    ED Discharge Orders     None          Griselda Norris, MD 02/13/24 [phone]

## 2024-04-25 DIAGNOSIS — I10 Essential (primary) hypertension: Secondary | ICD-10-CM | POA: Diagnosis not present

## 2024-04-25 DIAGNOSIS — E119 Type 2 diabetes mellitus without complications: Secondary | ICD-10-CM | POA: Diagnosis not present

## 2024-04-25 DIAGNOSIS — R112 Nausea with vomiting, unspecified: Secondary | ICD-10-CM | POA: Diagnosis not present

## 2024-04-25 DIAGNOSIS — L0591 Pilonidal cyst without abscess: Secondary | ICD-10-CM | POA: Diagnosis not present

## 2024-04-29 DIAGNOSIS — L910 Hypertrophic scar: Secondary | ICD-10-CM | POA: Diagnosis not present

## 2024-04-29 DIAGNOSIS — L732 Hidradenitis suppurativa: Secondary | ICD-10-CM | POA: Diagnosis not present

## 2024-06-30 DIAGNOSIS — L729 Follicular cyst of the skin and subcutaneous tissue, unspecified: Secondary | ICD-10-CM | POA: Diagnosis not present

## 2024-06-30 DIAGNOSIS — H9313 Tinnitus, bilateral: Secondary | ICD-10-CM | POA: Diagnosis not present

## 2024-06-30 DIAGNOSIS — N433 Hydrocele, unspecified: Secondary | ICD-10-CM | POA: Diagnosis not present

## 2024-07-01 DIAGNOSIS — N433 Hydrocele, unspecified: Secondary | ICD-10-CM | POA: Diagnosis not present

## 2024-07-09 ENCOUNTER — Other Ambulatory Visit: Payer: Self-pay

## 2024-07-09 ENCOUNTER — Emergency Department (HOSPITAL_COMMUNITY)
Admission: EM | Admit: 2024-07-09 | Discharge: 2024-07-10 | Disposition: A | Attending: Emergency Medicine | Admitting: Emergency Medicine

## 2024-07-09 DIAGNOSIS — Z87891 Personal history of nicotine dependence: Secondary | ICD-10-CM | POA: Diagnosis not present

## 2024-07-09 DIAGNOSIS — R079 Chest pain, unspecified: Secondary | ICD-10-CM | POA: Insufficient documentation

## 2024-07-09 DIAGNOSIS — R531 Weakness: Secondary | ICD-10-CM | POA: Diagnosis not present

## 2024-07-09 DIAGNOSIS — R0789 Other chest pain: Secondary | ICD-10-CM | POA: Diagnosis not present

## 2024-07-09 DIAGNOSIS — R002 Palpitations: Secondary | ICD-10-CM | POA: Diagnosis not present

## 2024-07-09 DIAGNOSIS — R110 Nausea: Secondary | ICD-10-CM | POA: Diagnosis not present

## 2024-07-09 DIAGNOSIS — R42 Dizziness and giddiness: Secondary | ICD-10-CM | POA: Diagnosis not present

## 2024-07-09 LAB — BASIC METABOLIC PANEL WITH GFR
Anion gap: 10 (ref 5–15)
BUN: 15 mg/dL (ref 6–20)
CO2: 27 mmol/L (ref 22–32)
Calcium: 9.2 mg/dL (ref 8.9–10.3)
Chloride: 101 mmol/L (ref 98–111)
Creatinine, Ser: 0.78 mg/dL (ref 0.61–1.24)
GFR, Estimated: >60 mL/min (ref >60)
Glucose, Bld: 94 mg/dL (ref 70–99)
Potassium: 4 mmol/L (ref 3.5–5.1)
Sodium: 138 mmol/L (ref 135–145)

## 2024-07-09 LAB — CBC
HCT: 44.5 % (ref 39.0–52.0)
Hemoglobin: 15.2 g/dL (ref 13.0–17.0)
MCH: 31.6 pg (ref 26.0–34.0)
MCHC: 34.2 g/dL (ref 30.0–36.0)
MCV: 92.5 fL (ref 80.0–100.0)
Platelets: 193 K/uL (ref 150–400)
RBC: 4.81 MIL/uL (ref 4.22–5.81)
RDW: 12.1 % (ref 11.5–15.5)
WBC: 9.7 K/uL (ref 4.0–10.5)
nRBC: 0 % (ref 0.0–0.2)

## 2024-07-09 LAB — TROPONIN I (HIGH SENSITIVITY)
Troponin I (High Sensitivity): <2 ng/L (ref <18)
Troponin I (High Sensitivity): <2 ng/L (ref <18)

## 2024-07-09 LAB — CBG MONITORING, ED: Glucose-Capillary: 100 mg/dL — ABNORMAL HIGH (ref 70–99)

## 2024-07-09 NOTE — ED Triage Notes (Signed)
 Pt to ED via [facility] from home c/o intermittent CP and dizziness x 1 week, worse with palpation.   Pt has been off prescribed suboxone x 4 days , just started back yesterday.   Last VS: 126/82, P 80, 100%RA, CBG 109.  No medications given by EMS.

## 2024-07-09 NOTE — ED Provider Triage Note (Signed)
 Emergency Medicine Provider Triage Evaluation Note  Lonnie Wilson , a 38 y.o. male  was evaluated in triage.  Pt complains of cp. Endorse pain to midchest with nausea, dizzy, sob that started earlier today.  Felt shaky.  No fever, productive cough, back pain or abd pain.  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  Appropriate orders placed.  Lonnie Wilson was informed that the remainder of the evaluation will be completed by another provider, this initial triage assessment does not replace that evaluation, and the importance of remaining in the ED until their evaluation is complete.     Nivia Colon, PA-C 07/09/24 1606

## 2024-07-10 NOTE — ED Provider Notes (Signed)
 [place] EMERGENCY DEPARTMENT AT [facility] Provider Note   CSN: [id number] Arrival date & time: 07/09/24  1545     Patient presents with: Chest Pain and Dizziness   Lonnie Wilson is a 38 y.o. male.   Presents to the emergency department for evaluation of chest pain.  Patient reports that he was doing some light housework earlier today when he developed chest pain, dizziness.  He reports that he felt like he was going to pass out but did not.  Symptoms have since resolved, back to his baseline.       Prior to Admission medications   Medication Sig Start Date End Date Taking? Authorizing Provider  amphetamine-dextroamphetamine (ADDERALL XR) 20 MG 24 hr capsule Take 20 mg by mouth daily.    [provider]  Buprenorphine HCl-Naloxone HCl 8-2 MG FILM Place under the tongue.    [provider]  clindamycin  (CLEOCIN ) 300 MG capsule Take 1 capsule (300 mg total) by mouth 3 (three) times daily. 04/01/23   Bernardino Ditch, NP  diazepam (VALIUM) 2 MG tablet Take 2 mg by mouth 2 (two) times daily. 02/24/23   [provider]  diazepam (VALIUM) 5 MG tablet Take 5 mg by mouth 2 (two) times daily. 02/24/23   [provider]  diclofenac  (VOLTAREN ) 75 MG EC tablet Take 1 tablet (75 mg total) by mouth 2 (two) times daily. 03/18/16   Betancourt, Tina A, NP  fluticasone  (FLONASE ) 50 MCG/ACT nasal spray Place 1 spray into both nostrils 2 (two) times daily. 03/18/16   Betancourt, Ellouise LABOR, NP  lisinopril (ZESTRIL) 10 MG tablet Take 10 mg by mouth daily.    [provider]  ondansetron  (ZOFRAN -ODT) 8 MG disintegrating tablet Take 1 tablet (8 mg total) by mouth every 8 (eight) hours as needed for nausea or vomiting. 04/01/23   Bernardino Ditch, NP  sodium chloride (RA SALINE NASAL SPRAY) 0.65 % nasal spray Place 2 sprays into the nose every 2 (two) hours while awake. 03/18/16 03/28/16  Betancourt, Ellouise LABOR, NP    Allergies: Patient has no known allergies.     Review of Systems  Updated Vital Signs BP 125/84 (BP Location: Right Arm)   Pulse 70   Temp 98.3 F (36.8 C)   Resp 19   Ht 5' 9 (1.753 m)   Wt 77.1 kg   SpO2 97%   BMI 25.10 kg/m   Physical Exam Vitals and nursing note reviewed.  Constitutional:      General: He is not in acute distress.    Appearance: He is well-developed.  HENT:     Head: Normocephalic and atraumatic.     Mouth/Throat:     Mouth: Mucous membranes are moist.  Eyes:     General: Vision grossly intact. Gaze aligned appropriately.     Extraocular Movements: Extraocular movements intact.     Conjunctiva/sclera: Conjunctivae normal.  Cardiovascular:     Rate and Rhythm: Normal rate and regular rhythm.     Pulses: Normal pulses.     Heart sounds: Normal heart sounds, S1 normal and S2 normal. No murmur heard.    No friction rub. No gallop.  Pulmonary:     Effort: Pulmonary effort is normal. No respiratory distress.     Breath sounds: Normal breath sounds.  Abdominal:     Palpations: Abdomen is soft.     Tenderness: There is no abdominal tenderness. There is no guarding or rebound.     Hernia: No hernia is present.  Musculoskeletal:        General: No swelling.     Cervical back: Full passive range of motion without pain, normal range of motion and neck supple. No pain with movement, spinous process tenderness or muscular tenderness. Normal range of motion.     Right lower leg: No edema.     Left lower leg: No edema.  Skin:    General: Skin is warm and dry.     Capillary Refill: Capillary refill takes less than 2 seconds.     Findings: No ecchymosis, erythema, lesion or wound.  Neurological:     Mental Status: He is alert and oriented to person, place, and time.     GCS: GCS eye subscore is 4. GCS verbal subscore is 5. GCS motor subscore is 6.     Cranial Nerves: Cranial nerves 2-12 are intact.     Sensory: Sensation is intact.     Motor: Motor function is intact. No weakness or abnormal muscle  tone.     Coordination: Coordination is intact.  Psychiatric:        Mood and Affect: Mood normal.        Speech: Speech normal.        Behavior: Behavior normal.     (all labs ordered are listed, but only abnormal results are displayed) Labs Reviewed  CBG MONITORING, ED - Abnormal; Notable for the following components:      Result Value   Glucose-Capillary 100 (*)    All other components within normal limits  BASIC METABOLIC PANEL WITH GFR  CBC  TROPONIN I (HIGH SENSITIVITY)  TROPONIN I (HIGH SENSITIVITY)    EKG: EKG Interpretation Date/Time:  Tuesday July 09 2024 15:52:22 EST Ventricular Rate:  92 PR Interval:  142 QRS Duration:  82 QT Interval:  346 QTC Calculation: 427 R Axis:   76  Text Interpretation: Normal sinus rhythm with sinus arrhythmia Nonspecific T wave abnormality Abnormal ECG When compared with ECG of 12-Feb-2024 19:54, No acute changes Confirmed by Haze Lonni PARAS [phone]) on 07/10/2024 12:10:57 AM  Radiology: No results found.   Procedures   Medications Ordered in the ED - No data to display                                  Medical Decision Making Amount and/or Complexity of Data Reviewed Labs: ordered.   Differential Diagnosis considered includes, but not limited to: STEMI; NSTEMI; myocarditis; pericarditis; pulmonary embolism; aortic dissection; pneumothorax; pneumonia; gastritis; musculoskeletal pain; arrhythmia  Patient with chest pain, palpitations, near syncope.  Symptoms occurred earlier today, patient now back to his baseline.  Cardiac evaluation unremarkable.  Lab work is normal.  Neurologic exam normal, no deficits.  Given that the patient had some palpitations at the time of his near syncope, will refer to cardiology for possible outpatient cardiac monitoring to evaluate for arrhythmia.  Patient appears safe for outpatient workup.  Given return precautions.      Final diagnoses:  Chest pain, unspecified type   Palpitations    ED Discharge Orders          Ordered    Ambulatory referral to Cardiology       Comments: If you have not heard from the Cardiology office within the next 72 hours please call [phone].   07/10/24 9964               Haze Lonni PARAS, MD 07/10/24 [phone]

## 2024-07-10 NOTE — ED Notes (Signed)
 Pt d/c home per EDP order. Discharge summary reviewed, pt verbalizes understanding. NAD. Discharged home with visitor.

## 2024-07-22 ENCOUNTER — Ambulatory Visit: Attending: Nurse Practitioner | Admitting: Nurse Practitioner

## 2024-07-22 NOTE — Progress Notes (Deleted)
 Office Visit    Patient Name: Lonnie Wilson Date of Encounter: 07/22/2024  Primary Care Provider:  [name], MD Primary Cardiologist:  None  Chief Complaint    38 year old male with a history of palpitations, presyncope, hypertension, chronic pancreatitis, substance use disorder, chronic pain, tinnitus, and tobacco use who presents for heart first clinic new patient evaluation.  Past Medical History    Past Medical History:  Diagnosis Date   Hypertension    Pancreatitis    No past surgical history on file.  Allergies  Allergies[1]   Labs/Other Studies Reviewed    The following studies were reviewed today:     Recent Labs: 02/12/2024: ALT 12 07/09/2024: BUN 15; Creatinine, Ser 0.78; Hemoglobin 15.2; Platelets 193; Potassium 4.0; Sodium 138  Recent Lipid Panel No results found for: CHOL, TRIG, HDL, CHOLHDL, VLDL, LDLCALC, LDLDIRECT  History of Present Illness    38 year old male with the above past medical history including palpitations, presyncope, hypertension, chronic pancreatitis, substance use disorder, chronic pain, tinnitus, and tobacco use.   He was seen in the ED on 08/06/2024 in the setting of palpitations, dizziness, near syncope. EKG showed normal sinus rhythm, 92 bpm, nonspecific T wave abnormality.  Troponin was negative x 2.  He was referred to cardiology for further evaluation.  He presents today for heart first clinic new patient evaluation.   CC: -Initial onset:  -Frequency/Duration:  -Associated symptoms:  -Aggravating/alleviating factors:  -Syncope/near syncope: -Prior cardiac history: -Prior ECG:  -Prior workup: -Prior treatment:  -Possible medication interactions:  -Caffeine:  -Alcohol:  -Tobacco: -OTC supplements:  -Comorbidities:  -Exercise level:  -Labs:  -Cardiac ROS: no chest pain, no shortness of breath, no PND, no orthopnea, no LE edema. She denies any headaches. -Family history:     Home  Medications    Current Outpatient Medications  Medication Sig Dispense Refill   amphetamine-dextroamphetamine (ADDERALL XR) 20 MG 24 hr capsule Take 20 mg by mouth daily.     Buprenorphine HCl-Naloxone HCl 8-2 MG FILM Place under the tongue.     clindamycin  (CLEOCIN ) 300 MG capsule Take 1 capsule (300 mg total) by mouth 3 (three) times daily. 21 capsule 0   diazepam (VALIUM) 2 MG tablet Take 2 mg by mouth 2 (two) times daily.     diazepam (VALIUM) 5 MG tablet Take 5 mg by mouth 2 (two) times daily.     diclofenac  (VOLTAREN ) 75 MG EC tablet Take 1 tablet (75 mg total) by mouth 2 (two) times daily. 60 tablet 0   fluticasone  (FLONASE ) 50 MCG/ACT nasal spray Place 1 spray into both nostrils 2 (two) times daily. 16 g 0   lisinopril (ZESTRIL) 10 MG tablet Take 10 mg by mouth daily.     ondansetron  (ZOFRAN -ODT) 8 MG disintegrating tablet Take 1 tablet (8 mg total) by mouth every 8 (eight) hours as needed for nausea or vomiting. 20 tablet 0   sodium chloride (RA SALINE NASAL SPRAY) 0.65 % nasal spray Place 2 sprays into the nose every 2 (two) hours while awake. 30 mL 12   No current facility-administered medications for this visit.     Review of Systems    ***.  All other systems reviewed and are otherwise negative except as noted above.    Physical Exam    VS:  There were no vitals taken for this visit. , BMI There is no height or weight on file to calculate BMI.     GEN: Well nourished, well developed, in no  acute distress. HEENT: normal. Neck: Supple, no JVD, carotid bruits, or masses. Cardiac: RRR, no murmurs, rubs, or gallops. No clubbing, cyanosis, edema.  Radials/DP/PT 2+ and equal bilaterally.  Respiratory:  Respirations regular and unlabored, clear to auscultation bilaterally. GI: Soft, nontender, nondistended, BS + x 4. MS: no deformity or atrophy. Skin: warm and dry, no rash. Neuro:  Strength and sensation are intact. Psych: Normal affect.  Accessory Clinical Findings     ECG personally reviewed by me today -    - no acute changes.   Lab Results  Component Value Date   WBC 9.7 07/09/2024   HGB 15.2 07/09/2024   HCT 44.5 07/09/2024   MCV 92.5 07/09/2024   PLT 193 07/09/2024   Lab Results  Component Value Date   CREATININE 0.78 07/09/2024   BUN 15 07/09/2024   NA 138 07/09/2024   K 4.0 07/09/2024   CL 101 07/09/2024   CO2 27 07/09/2024   Lab Results  Component Value Date   ALT 12 02/12/2024   AST 8 (L) 02/12/2024   ALKPHOS 64 02/12/2024   BILITOT 1.1 02/12/2024   No results found for: CHOL, HDL, LDLCALC, LDLDIRECT, TRIG, CHOLHDL  No results found for: HGBA1C  Assessment & Plan    1.  ***  No BP recorded.  {Refresh Note OR Click here to enter BP  :1}***   Damien JAYSON Braver, NP 07/22/2024, 6:13 AM       [1] No Known Allergies

## 2024-08-05 ENCOUNTER — Ambulatory Visit: Admitting: Physician Assistant

## 2024-08-06 NOTE — Progress Notes (Deleted)
" °  Cardiology Office Note   Date:  08/06/2024  ID:  Lonnie Wilson, DOB October 23, 1985, MRN [id number] PCP: [name], MD  [facility] Providers Cardiologist:  None { Click to update primary MD,subspecialty MD or APP then REFRESH:1}    History of Present Illness Lonnie Wilson is a 38 y.o. male with a past medical history of type 2 diabetes, hypertension, history of drug abuse now on buprenorphine ***.  Patient presents for evaluation of chest pain, palpitations  Patient was seen in the ED on 07/09/2024 for evaluation of chest pain.  Reported that he had been doing some light housework when he developed chest pain and dizziness.  In the ED, workup was reassuring.  EKG showed normal sinus rhythm with heart rate 92 bpm, nonspecific T wave abnormality.  High-sensitivity troponin negative x 2.  BMP and CBC within normal limits.  Patient was referred to cardiology  ROS: ***  Studies Reviewed      *** Risk Assessment/Calculations {Does this patient have ATRIAL FIBRILLATION?:[phone]} No BP recorded.  {Refresh Note OR Click here to enter BP  :1}***       Physical Exam VS:  There were no vitals taken for this visit.       Wt Readings from Last 3 Encounters:  07/09/24 170 lb (77.1 kg)  02/12/24 178 lb (80.7 kg)  04/01/23 173 lb (78.5 kg)    GEN: Well nourished, well developed in no acute distress NECK: No JVD; No carotid bruits CARDIAC: ***RRR, no murmurs, rubs, gallops RESPIRATORY:  Clear to auscultation without rales, wheezing or rhonchi  ABDOMEN: Soft, non-tender, non-distended EXTREMITIES:  No edema; No deformity   ASSESSMENT AND PLAN ***    {Are you ordering a CV Procedure (e.g. stress test, cath, DCCV, TEE, etc)?   Press F2        :[id number]}  Dispo: ***  Signed, Rollo FABIENE Louder, PA-C   "

## 2024-08-09 ENCOUNTER — Ambulatory Visit: Admitting: Cardiology

## 2024-08-12 ENCOUNTER — Ambulatory Visit: Admitting: Cardiology

## 2024-08-13 ENCOUNTER — Ambulatory Visit

## 2024-08-13 ENCOUNTER — Ambulatory Visit: Attending: Physician Assistant | Admitting: Physician Assistant

## 2024-08-13 ENCOUNTER — Encounter: Payer: Self-pay | Admitting: Physician Assistant

## 2024-08-13 VITALS — BP 118/82 | HR 120 | Ht 69.0 in

## 2024-08-13 DIAGNOSIS — R55 Syncope and collapse: Secondary | ICD-10-CM

## 2024-08-13 DIAGNOSIS — R072 Precordial pain: Secondary | ICD-10-CM | POA: Diagnosis not present

## 2024-08-13 DIAGNOSIS — R000 Tachycardia, unspecified: Secondary | ICD-10-CM | POA: Diagnosis not present

## 2024-08-13 DIAGNOSIS — R0602 Shortness of breath: Secondary | ICD-10-CM | POA: Insufficient documentation

## 2024-08-13 DIAGNOSIS — R42 Dizziness and giddiness: Secondary | ICD-10-CM | POA: Insufficient documentation

## 2024-08-13 MED ORDER — MECLIZINE HCL 12.5 MG PO TABS: 12.5000 mg | ORAL_TABLET | Freq: Three times a day (TID) | ORAL | 0 refills | Status: DC | PRN

## 2024-08-13 NOTE — Patient Instructions (Signed)
 Medication Instructions:  Your physician has recommended you make the following change in your medication:   START Meclizine  12.5 mg taking 1 tablet three times a day only as needed for dizziness  *If you need a refill on your cardiac medications before your next appointment, please call your pharmacy*  Lab Work: None ordered  If you have labs (blood work) drawn today and your tests are completely normal, you will receive your results only by: MyChart Message (if you have MyChart) OR A paper copy in the mail If you have any lab test that is abnormal or we need to change your treatment, we will call you to review the results.  Testing/Procedures: Your physician has requested that you have an echocardiogram. Echocardiography is a painless test that uses sound waves to create images of your heart. It provides your doctor with information about the size and shape of your heart and how well your hearts chambers and valves are working. This procedure takes approximately one hour. There are no restrictions for this procedure. Please do NOT wear cologne, perfume, aftershave, or lotions (deodorant is allowed). Please arrive 15 minutes prior to your appointment time.  Please note: We ask at that you not bring children with you during ultrasound (echo/ vascular) testing. Due to room size and safety concerns, children are not allowed in the ultrasound rooms during exams. Our front office staff cannot provide observation of children in our lobby area while testing is being conducted. An adult accompanying a patient to their appointment will only be allowed in the ultrasound room at the discretion of the ultrasound technician under special circumstances. We apologize for any inconvenience.   ZIO XT- Long Term Monitor Instructions  Your physician has requested you wear a ZIO patch monitor for 14 days.  This is a single patch monitor. [employer] supplies one patch monitor per enrollment. Additional stickers  are not available. Please do not apply patch if you will be having a Nuclear Stress Test,  Echocardiogram, Cardiac CT, MRI, or Chest Xray during the period you would be wearing the  monitor. The patch cannot be worn during these tests. You cannot remove and re-apply the  ZIO XT patch monitor.  Your ZIO patch monitor will be mailed 3 day USPS to your address on file. It may take 3-5 days  to receive your monitor after you have been enrolled.  Once you have received your monitor, please review the enclosed instructions. Your monitor  has already been registered assigning a specific monitor serial # to you.  Billing and Patient Assistance Program Information  We have supplied [employer] with any of your insurance information on file for billing purposes. [employer] offers a sliding scale Patient Assistance Program for patients that do not have  insurance, or whose insurance does not completely cover the cost of the ZIO monitor.  You must apply for the Patient Assistance Program to qualify for this discounted rate.  To apply, please call [employer] at [phone], select option 4, select option 2, ask to apply for  Patient Assistance Program. Meredeth will ask your household income, and how many people  are in your household. They will quote your out-of-pocket cost based on that information.  [employer] will also be able to set up a 87-month, interest-free payment plan if needed.  Applying the monitor   Shave hair from upper left chest.  Hold abrader disc by orange tab. Rub abrader in 40 strokes over the upper left chest as  indicated in your monitor instructions.  Clean area with 4 enclosed alcohol pads. Let dry.  Apply patch as indicated in monitor instructions. Patch will be placed under collarbone on left  side of chest with arrow pointing upward.  Rub patch adhesive wings for 2 minutes. Remove white label marked 1. Remove the white  label marked 2. Rub patch adhesive wings for 2 additional  minutes.  While looking in a mirror, press and release button in center of patch. A small green light will  flash 3-4 times. This will be your only indicator that the monitor has been turned on.  Do not shower for the first 24 hours. You may shower after the first 24 hours.  Press the button if you feel a symptom. You will hear a small click. Record Date, Time and  Symptom in the Patient Logbook.  When you are ready to remove the patch, follow instructions on the last 2 pages of Patient  Logbook. Stick patch monitor onto the last page of Patient Logbook.  Place Patient Logbook in the blue and white box. Use locking tab on box and tape box closed  securely. The blue and white box has prepaid postage on it. Please place it in the mailbox as  soon as possible. Your physician should have your test results approximately 7 days after the  monitor has been mailed back to [facility].  Call [facility] Customer Care at [phone] if you have questions regarding  your ZIO XT patch monitor. Call them immediately if you see an orange light blinking on your  monitor.  If your monitor falls off in less than 4 days, contact our Monitor department at [phone].  If your monitor becomes loose or falls off after 4 days call [employer] at [phone] for  suggestions on securing your monitor    You have been referred to [facility] .  They will call you to arrange this.   Follow-Up: At [facility], you and your health needs are our priority.  As part of our continuing mission to provide you with exceptional heart care, our providers are all part of one team.  This team includes your primary Cardiologist (physician) and Advanced Practice Providers or APPs (Physician Assistants and Nurse Practitioners) who all work together to provide you with the care you need, when you need it.  Your next appointment:   BASED UPON TEST RESULTS   Provider:   Glendia Ferrier, PA-C          We  recommend signing up for the patient portal called MyChart.  Sign up information is provided on this After Visit Summary.  MyChart is used to connect with patients for Virtual Visits (Telemedicine).  Patients are able to view lab/test results, encounter notes, upcoming appointments, etc.  Non-urgent messages can be sent to your provider as well.   To learn more about what you can do with MyChart, go to [email or web address].   Other Instructions

## 2024-08-13 NOTE — Progress Notes (Signed)
 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 History of Present Illness Lonnie Wilson is a 39 y.o. male referred by Lonnie Charlie LABOR, MD for chest pain, palpitations.  He was seen in the emergency room with chest pain and dizziness 07/09/24.  He described near syncope.  He noted palpitations prior to his episode of dizziness.  Workup was unremarkable.  Troponins were negative x 2.  He has experienced chest pain, palpitations, dizziness, shortness of breath, and a sensation of impending blackout for the past four years. These symptoms have been persistent. The chest pain is constant, worsened by movement, stress, and palpation. Standing up provokes symptoms, and laying down sometimes helps, but the relief is not consistent and symptoms may moderate rather than fully resolve. He has not experienced complete loss of consciousness but has had near-blackout episodes.  He also notes dizziness described as spinning.  This is made worse with positional changes and he has described visual fixation to alleviate symptoms.  He has a history of hypertension but is not currently on medication for it, as he believed his blood pressure was improving without it. He was previously on metformin for pre-diabetes but discontinued it due to an allergic reaction.   He has a significant history of substance use, including alcohol and opiate dependence, but reports being in a treatment program and has not used alcohol or drugs for a significant period. He was  previously on buprenorphine, gabapentin, Valium, and Adderall but stopped these medications about a year ago, which he believes helped alleviate some symptoms. He also reports trying to reduce caffeine intake.  Family history is notable for heart attacks on both maternal and paternal sides, with his maternal grandmother and paternal grandmother having had heart attacks. He is single, has no children, and does not currently smoke cigarettes, although he did in the past.  In the review of symptoms, he denies wheezing, coughing, fever, chills, or cancer. He reports experiencing chills, bloody stool, and dizziness described as lightheadedness with visual disturbances like seeing 'little black dots'. He denies any recent drug or alcohol use.   ROS-See HPI     Studies Reviewed:  EKG Interpretation Date/Time:  Tuesday August 13 2024 14:45:47 EST Ventricular Rate:  120 PR Interval:  132 QRS Duration:  74 QT Interval:  290 QTC Calculation: 409 R Axis:   75  Text Interpretation: Sinus tachycardia Right atrial enlargement Nonspecific ST and T wave abnormality Confirmed by Lonnie Wilson [phone]) on 08/13/2024 2:48:05 PM    LABS 06/30/2024: ALT 10, TSH 3.47 07/09/2024: hsTroponin I <2, <2 07/09/24: Hgb 15.2, PLT 193K, K 4, creatinine 0.78, eGFR >60  EKG 07/09/2024: NSR, HR 92, normal axis, QTc 427, nonspecific ST-T wave changes         Physical Exam:  VS:  BP 118/82   Pulse (!) 120   Ht 5' 9 (1.753 m)   BMI 25.10 kg/m     Orthostatic VS for the past 24 hrs (Last 3 readings):  BP-  Lying Pulse- Lying BP- Sitting Pulse- Sitting BP- Standing at 0 minutes Pulse- Standing at 0 minutes BP- Standing at 3 minutes Pulse- Standing at 3 minutes  08/13/24 1540 110/80 111 (!) 137/103 118 121/81 134 117/89 130    Wt Readings from Last 3 Encounters:  07/09/24 170 lb (77.1 kg)  02/12/24 178 lb (80.7 kg)  04/01/23 173 lb (78.5 kg)    Constitutional:      Appearance: Healthy appearance. Not in distress.   Neck:     Vascular: No JVR. JVD normal.  Pulmonary:     Breath sounds: Normal breath sounds. No wheezing. No rales.  Chest:     Chest wall: Tender to palpatation.  Cardiovascular:     Tachycardia present. Regular rhythm.     Murmurs: There is no murmur.     No rub.  Edema:    Peripheral edema absent.  Abdominal:     Palpations: Abdomen is soft.       Assessment and Plan:    Assessment & Plan Precordial chest pain Shortness of breath He notes chronic chest pain for over four years, exacerbated by movement and stress, with associated tachycardia and presyncope. Symptoms are not consistent with obstructive CAD given the chronicity and lack of EKG changes.  He had negative Troponins when he went to the ED last month, despite chest pain going on for a very long time. He has a hard time describing his symptoms. There is some positional component to his symptoms. However, his chest pain is not c/w pericarditis. Given the chronicity, it would be very unusual for his symptoms to be explained by pericarditis. I do not think that he needs ESR, CRP. I will get an echocardiogram. If there is any suggestion of effusion or pericardial thickening, we may need to get a cardiac MRI. He does have noted sinus tachycardia. Orthostatic vital signs today are not c/w orthostatic hypotension or POTS (Postural Orthostatic Hypotension). But he does have somewhat of an increase in his HR. Question if he is volume deplete, contributing to some of his dizziness. He denies recent drug or alcohol use. He abruptly stopped Buprenorphine over a year ago. Therefore, I do not think he is experiencing withdrawal. He does describe what sounds like positional vertigo. - Order echocardiogram to rule out structural heart disease. - Order Zio patch monitor x 14 days to better assess heart rhythm and burden of tachycardia - If he has evidence of inappropriate sinus tachycardia, consider beta-blocker therapy - Advised follow-up with  primary care physician - he has an appointment January 17th. - Follow-up with cardiology based upon test results Orders:   EKG 12-Lead   ECHOCARDIOGRAM COMPLETE; Future   LONG TERM MONITOR (3-14 DAYS); Future  Tachycardia Near syncope He has sinus tachycardia noted on EKG today.  Recent hemoglobin was normal.  Recent TSH was normal.  He has not had any recent illnesses.  Orthostatic vital signs today do demonstrate somewhat of an increase in heart rate but these are not consistent with orthostatic hypotension or POTS.  Question if he may be somewhat volume depleted.  He may have inappropriate sinus tachycardia. - Obtain ZIO patch monitor x 2 weeks to better assess heart rate and rhythm, rule out significant arrhythmias - Consider beta-blocker if monitor c/w inappropriate sinus tachycardia - Obtain echocardiogram - I advised pt to hydrate well.   Orders:   EKG 12-Lead   ECHOCARDIOGRAM COMPLETE; Future   LONG TERM MONITOR (3-14 DAYS); Future  Dizziness He describes what sounds  like positional vertigo. Based upon his symptoms, I will Rx meclizine  for him to take as needed and refer him to vestibular rehab. If he truly has BPPV, I suspect this may the cause of most of his symptoms.  Orders:   meclizine  (ANTIVERT ) 12.5 MG tablet; Take 1 tablet (12.5 mg total) by mouth 3 (three) times daily as needed for dizziness.   Ambulatory referral to Physical Therapy          Dispo:  Return for follow up as needed based upon test results.  Signed, Glendia Ferrier, PA-C    "

## 2024-08-13 NOTE — Progress Notes (Unsigned)
 Enrolled patient for a 14 day Zio XT monitor to be mailed to patients home  Krasowski to read

## 2024-08-14 ENCOUNTER — Telehealth: Payer: Self-pay | Admitting: Physician Assistant

## 2024-08-14 NOTE — Telephone Encounter (Signed)
 Patient would like to have records sent to [facility], Dr. Coit at fax# [phone].   phone# [phone].

## 2024-08-15 ENCOUNTER — Telehealth: Payer: Self-pay | Admitting: Physician Assistant

## 2024-08-15 DIAGNOSIS — R42 Dizziness and giddiness: Secondary | ICD-10-CM

## 2024-08-15 MED ORDER — MECLIZINE HCL 12.5 MG PO TABS: 12.5000 mg | ORAL_TABLET | Freq: Three times a day (TID) | ORAL | 0 refills | Status: DC | PRN

## 2024-08-15 NOTE — Telephone Encounter (Signed)
" °*  STAT* If patient is at the pharmacy, call can be transferred to refill team.   1. Which medications need to be refilled? (please list name of each medication and dose if known)   meclizine  (ANTIVERT ) 12.5 MG tablet   2. Would you like to learn more about the convenience, safety, & potential cost savings by using the [facility]?   3. Are you open to using the [facility] (Type [facility]. ).  4. Which pharmacy/location (including street and city if local pharmacy) is medication to be sent to?  [id number] - [place], [place] - [address]   5. Do they need a 30 day or 90 day supply?   30 day  Patient stated his medication was sent to the wrong pharmacy [facility]) and needs this medication sent to [id number] [place], [place] - [address]   "

## 2024-08-15 NOTE — Telephone Encounter (Signed)
 Pt's medication was sent to wrong pharmacy, resent medication to the correct pharmacy as requested. Confirmation received.

## 2024-08-27 ENCOUNTER — Telehealth: Payer: Self-pay | Admitting: Physician Assistant

## 2024-08-27 ENCOUNTER — Ambulatory Visit (HOSPITAL_BASED_OUTPATIENT_CLINIC_OR_DEPARTMENT_OTHER)

## 2024-08-27 DIAGNOSIS — R42 Dizziness and giddiness: Secondary | ICD-10-CM

## 2024-08-27 NOTE — Telephone Encounter (Signed)
" °*  STAT* If patient is at the pharmacy, call can be transferred to refill team.   1. Which medications need to be refilled? (please list name of each medication and dose if known)   meclizine  (ANTIVERT ) 12.5 MG tablet    2. Would you like to learn more about the convenience, safety, & potential cost savings by using the [facility]? no   3. Are you open to using the [facility] (Type [facility]. No    4. Which pharmacy/location (including street and city if local pharmacy) is medication to be sent to?  [id number] - [place], [place] - [address]     5. Do they need a 30 day or 90 day supply?   "

## 2024-09-03 MED ORDER — MECLIZINE HCL 12.5 MG PO TABS: 12.5000 mg | ORAL_TABLET | Freq: Three times a day (TID) | ORAL | 0 refills | Status: AC | PRN

## 2024-09-03 NOTE — Telephone Encounter (Signed)
 Ok to refill x 1 (#30, no refills).  Pt will need to follow up with PCP for future refills. Glendia Ferrier, PA-C    09/03/2024 8:54 AM

## 2024-09-16 ENCOUNTER — Ambulatory Visit (HOSPITAL_BASED_OUTPATIENT_CLINIC_OR_DEPARTMENT_OTHER)
